# Patient Record
Sex: Female | Born: 1940 | Race: White | Hispanic: No | Marital: Married | Smoking: Never smoker
Health system: Southern US, Community
[De-identification: ages and names within clinical notes are randomized; demographics above are authoritative.]

## PROBLEM LIST (undated history)

## (undated) DIAGNOSIS — M1990 Unspecified osteoarthritis, unspecified site: Secondary | ICD-10-CM

## (undated) HISTORY — PX: TONSILLECTOMY: SUR1361

## (undated) HISTORY — PX: DILATION AND CURETTAGE OF UTERUS: SHX78

## (undated) HISTORY — PX: BREAST EXCISIONAL BIOPSY: SUR124

## (undated) HISTORY — PX: COLONOSCOPY: SHX174

## (undated) HISTORY — PX: APPENDECTOMY: SHX54

---

## 1999-09-09 ENCOUNTER — Other Ambulatory Visit: Admission: RE | Admit: 1999-09-09 | Discharge: 1999-09-09 | Payer: Self-pay | Admitting: Obstetrics and Gynecology

## 1999-10-06 ENCOUNTER — Encounter: Payer: Self-pay | Admitting: Obstetrics and Gynecology

## 1999-10-06 ENCOUNTER — Encounter: Admission: RE | Admit: 1999-10-06 | Discharge: 1999-10-06 | Payer: Self-pay | Admitting: Obstetrics and Gynecology

## 2000-02-14 ENCOUNTER — Encounter (INDEPENDENT_AMBULATORY_CARE_PROVIDER_SITE_OTHER): Payer: Self-pay | Admitting: *Deleted

## 2000-02-14 ENCOUNTER — Ambulatory Visit (HOSPITAL_COMMUNITY): Admission: RE | Admit: 2000-02-14 | Discharge: 2000-02-14 | Payer: Self-pay | Admitting: Gastroenterology

## 2000-11-01 ENCOUNTER — Other Ambulatory Visit: Admission: RE | Admit: 2000-11-01 | Discharge: 2000-11-01 | Payer: Self-pay | Admitting: Obstetrics and Gynecology

## 2000-11-12 ENCOUNTER — Encounter: Payer: Self-pay | Admitting: Obstetrics and Gynecology

## 2000-11-12 ENCOUNTER — Encounter: Admission: RE | Admit: 2000-11-12 | Discharge: 2000-11-12 | Payer: Self-pay | Admitting: Obstetrics and Gynecology

## 2000-11-15 ENCOUNTER — Encounter: Admission: RE | Admit: 2000-11-15 | Discharge: 2000-11-15 | Payer: Self-pay | Admitting: Obstetrics and Gynecology

## 2000-11-15 ENCOUNTER — Encounter: Payer: Self-pay | Admitting: Obstetrics and Gynecology

## 2002-09-02 ENCOUNTER — Other Ambulatory Visit: Admission: RE | Admit: 2002-09-02 | Discharge: 2002-09-02 | Payer: Self-pay | Admitting: Obstetrics and Gynecology

## 2002-10-07 ENCOUNTER — Encounter: Admission: RE | Admit: 2002-10-07 | Discharge: 2002-10-07 | Payer: Self-pay | Admitting: Obstetrics and Gynecology

## 2002-10-07 ENCOUNTER — Encounter: Payer: Self-pay | Admitting: Obstetrics and Gynecology

## 2003-10-29 ENCOUNTER — Encounter: Admission: RE | Admit: 2003-10-29 | Discharge: 2003-10-29 | Payer: Self-pay | Admitting: Obstetrics and Gynecology

## 2003-11-04 ENCOUNTER — Other Ambulatory Visit: Admission: RE | Admit: 2003-11-04 | Discharge: 2003-11-04 | Payer: Self-pay | Admitting: Obstetrics and Gynecology

## 2003-11-11 ENCOUNTER — Encounter: Admission: RE | Admit: 2003-11-11 | Discharge: 2003-11-11 | Payer: Self-pay | Admitting: Obstetrics and Gynecology

## 2004-01-08 ENCOUNTER — Ambulatory Visit (HOSPITAL_COMMUNITY): Admission: RE | Admit: 2004-01-08 | Discharge: 2004-01-08 | Payer: Self-pay | Admitting: Gastroenterology

## 2004-11-11 ENCOUNTER — Encounter: Admission: RE | Admit: 2004-11-11 | Discharge: 2004-11-11 | Payer: Self-pay | Admitting: Obstetrics and Gynecology

## 2004-12-21 ENCOUNTER — Other Ambulatory Visit: Admission: RE | Admit: 2004-12-21 | Discharge: 2004-12-21 | Payer: Self-pay | Admitting: Obstetrics and Gynecology

## 2005-11-16 ENCOUNTER — Encounter: Admission: RE | Admit: 2005-11-16 | Discharge: 2005-11-16 | Payer: Self-pay | Admitting: Obstetrics and Gynecology

## 2006-11-29 ENCOUNTER — Encounter: Admission: RE | Admit: 2006-11-29 | Discharge: 2006-11-29 | Payer: Self-pay | Admitting: Obstetrics and Gynecology

## 2007-12-02 ENCOUNTER — Encounter: Admission: RE | Admit: 2007-12-02 | Discharge: 2007-12-02 | Payer: Self-pay | Admitting: Obstetrics and Gynecology

## 2008-12-02 ENCOUNTER — Encounter: Admission: RE | Admit: 2008-12-02 | Discharge: 2008-12-02 | Payer: Self-pay | Admitting: Obstetrics and Gynecology

## 2010-02-04 ENCOUNTER — Encounter: Admission: RE | Admit: 2010-02-04 | Discharge: 2010-02-04 | Payer: Self-pay | Admitting: Obstetrics and Gynecology

## 2010-08-26 NOTE — Procedures (Signed)
[place]  Patient:    Kara Willis, Kara Willis Visit Number: [id number] MRN: [id number]          Service Type: END Location: ENDO Attending Physician:  Charna Kanija Proc. Date: 02/14/00 Admit Date:  02/14/2000   CC:         Silverio Lay, M.D.  Merlene Laughter. Renae Gloss, M.D.   Procedure Report  DATE OF BIRTH:  Feb 21, 1941  REFERRING PHYSICIAN:  PROCEDURE PERFORMED:  Colonoscopy with snare polypectomy x 1 and hot biopsy x 1.  ENDOSCOPIST:  Anselmo Rod, M.D.  INSTRUMENT USED:  Olympus video colonoscope.  INDICATIONS FOR PROCEDURE:  Guaiac positive stools in a 70 year old white female, rule out colonic polyps, masses, hemorrhoids, etc.  PREPROCEDURE PREPARATION:  Informed consent was procured from the patient. The patient was fasted for eight hours prior to the procedure and prepped with a bottle of magnesium citrate and a gallon of NuLytely the night prior to the procedure.  PREPROCEDURE PHYSICAL:  The patient had stable vital signs.  Neck supple. Chest clear to auscultation.  S1, S2 regular.  Abdomen soft with normal abdominal bowel sounds.  DESCRIPTION OF PROCEDURE:  The patient was placed in the left lateral decubitus position and sedated with 100 mcg of fentanyl and 3 mg of Versed intravenously.  Once the patient was adequately sedated and maintained on low-flow oxygen and continuous cardiac monitoring, the Olympus video colonoscope was advanced from the rectum to the cecum without difficulty. Except for a few early diverticula in the left colon (very small) and small sessile polyp in the rectum and at 15 cm.  No other abnormalities were seen. The polyp in the rectum was hot biopsied. The polyp at 15 cm was removed by snare polypectomy.  The patient tolerated the procedure well without complication.  No other masses or polyps were seen.  IMPRESSION: 1. Two polyps, one in the rectum, one at 15 cm. 2. Early diverticula in  the right colon.  RECOMMENDATIONS: 1. Await pathology results. 2. Increase fluid and fiber in the diet. 3. Outpatient follow-up in the next two weeks. Attending Physician:  Charna Lorrene DD:  02/14/00 TD:  02/14/00 Job: [id number]

## 2010-08-26 NOTE — Op Note (Signed)
Kara Willis, DOWSE           ACCOUNT NO.:  [id number]   MEDICAL RECORD NO.:  [id number]          PATIENT TYPE:  AMB   LOCATION:  ENDO                         FACILITY:  [facility]   PHYSICIAN:  Anselmo Rod, M.D.  DATE OF BIRTH:  04-07-1941   DATE OF PROCEDURE:  01/08/2004  DATE OF DISCHARGE:                                 OPERATIVE REPORT   PROCEDURE:  Screening colonoscopy.   ENDOSCOPIST:  Anselmo Rod, M.D.   INSTRUMENT:  Olympus video colonoscope.   INDICATIONS FOR PROCEDURE:  A 70 year old white female with history of  adenomatous polyps removed in the past with rectal bleeding. Rule out  recurrent polyps.   PRE-PROCEDURE PREPARATION:  Informed consent was procured from the patient.  Patient fasted for 8 hours prior to the procedure and prepped with a bottle  of magnesium citrate and a gallon of GoLYTELY the night prior to the  procedure.  Pre-procedure physical:  Patient had stable vital signs, neck  supple, chest clear to auscultation, S1/S2 regular, abdomen soft with normal  bowel sounds.   DESCRIPTION OF PROCEDURE:  The patient was placed in the left lateral  decubitus position, sedated with 70 mg of Demerol and 7 mg of Versed in  slow, incremental doses.  Once the patient was adequately sedated and  maintained on low flow oxygen, continuous cardiac monitoring; the Olympus  video colonoscope was advanced from the rectum to the cecum.  Small internal  hemorrhoids were seen on retroflexion.  The patient's position had to be  turned from the left lateral to supine, and the right lateral position with  gentle application of abdominal pressure. No masses or polyps were  identified. There was a large amount of residual stool in the colon.  Small  lesions could have been missed.   IMPRESSION:  1.  Small internal hemorrhoids, otherwise unrevealing colonoscopy.  2.  A significant amount of residual stool in the colon, multiple washings      done, patient's position  changed multiple times.  Small lesions could      have been missed.   RECOMMENDATIONS:  1.  Continue a high fiber diet with liberal fluids intake.  2.  Repeat colonoscopy had been recommended in the next 5 years unless the      patient develops any abnormal      symptoms in the interim.  3.  Outpatient follow up if rectal bleeding continues to be a problem.  4.  Avoid all nonsteroidals for now.       JNM/MEDQ  D:  01/08/2004  T:  01/09/2004  Job:  [id number]   cc:   Dois Davenport A. Rivard, M.D.  [address]  [place]  [place] [address]  Fax: [phone]

## 2011-03-09 ENCOUNTER — Other Ambulatory Visit: Payer: Self-pay | Admitting: Obstetrics and Gynecology

## 2011-03-09 DIAGNOSIS — Z1231 Encounter for screening mammogram for malignant neoplasm of breast: Secondary | ICD-10-CM

## 2011-04-14 ENCOUNTER — Ambulatory Visit
Admission: RE | Admit: 2011-04-14 | Discharge: 2011-04-14 | Disposition: A | Discharge status: Home / Self Care | Payer: Medicare Other | Source: Ambulatory Visit | Attending: Obstetrics and Gynecology | Admitting: Obstetrics and Gynecology

## 2011-04-14 DIAGNOSIS — Z1231 Encounter for screening mammogram for malignant neoplasm of breast: Secondary | ICD-10-CM

## 2012-03-14 ENCOUNTER — Other Ambulatory Visit: Payer: Self-pay | Admitting: Obstetrics and Gynecology

## 2012-03-14 DIAGNOSIS — Z1231 Encounter for screening mammogram for malignant neoplasm of breast: Secondary | ICD-10-CM

## 2012-04-22 ENCOUNTER — Ambulatory Visit
Admission: RE | Admit: 2012-04-22 | Discharge: 2012-04-22 | Disposition: A | Discharge status: Home / Self Care | Payer: Medicare Other | Source: Ambulatory Visit | Attending: Obstetrics and Gynecology | Admitting: Obstetrics and Gynecology

## 2012-04-22 DIAGNOSIS — Z1231 Encounter for screening mammogram for malignant neoplasm of breast: Secondary | ICD-10-CM

## 2012-06-26 ENCOUNTER — Other Ambulatory Visit: Payer: Self-pay

## 2013-05-12 ENCOUNTER — Other Ambulatory Visit: Payer: Self-pay

## 2013-05-12 DIAGNOSIS — Z1231 Encounter for screening mammogram for malignant neoplasm of breast: Secondary | ICD-10-CM

## 2013-05-29 ENCOUNTER — Ambulatory Visit
Admission: RE | Admit: 2013-05-29 | Discharge: 2013-05-29 | Disposition: A | Discharge status: Home / Self Care | Payer: Medicare HMO | Source: Ambulatory Visit

## 2013-05-29 DIAGNOSIS — Z1231 Encounter for screening mammogram for malignant neoplasm of breast: Secondary | ICD-10-CM

## 2013-09-23 ENCOUNTER — Other Ambulatory Visit: Payer: Self-pay | Admitting: Family Medicine

## 2013-09-23 DIAGNOSIS — E2839 Other primary ovarian failure: Secondary | ICD-10-CM

## 2013-10-23 ENCOUNTER — Ambulatory Visit
Admission: RE | Admit: 2013-10-23 | Discharge: 2013-10-23 | Disposition: A | Discharge status: Home / Self Care | Payer: Medicare HMO | Source: Ambulatory Visit | Attending: Family Medicine | Admitting: Family Medicine

## 2013-10-23 DIAGNOSIS — E2839 Other primary ovarian failure: Secondary | ICD-10-CM

## 2014-02-19 ENCOUNTER — Other Ambulatory Visit: Payer: Self-pay | Admitting: Orthopaedic Surgery

## 2014-04-08 ENCOUNTER — Encounter (HOSPITAL_COMMUNITY)
Admission: RE | Admit: 2014-04-08 | Discharge: 2014-04-08 | Disposition: A | Discharge status: Home / Self Care | Payer: Medicare HMO | Source: Ambulatory Visit | Attending: Orthopaedic Surgery | Admitting: Orthopaedic Surgery

## 2014-04-08 ENCOUNTER — Encounter (HOSPITAL_COMMUNITY): Payer: Self-pay

## 2014-04-08 DIAGNOSIS — Z681 Body mass index (BMI) 19 or less, adult: Secondary | ICD-10-CM | POA: Insufficient documentation

## 2014-04-08 DIAGNOSIS — M1990 Unspecified osteoarthritis, unspecified site: Secondary | ICD-10-CM | POA: Diagnosis not present

## 2014-04-08 DIAGNOSIS — J8410 Pulmonary fibrosis, unspecified: Secondary | ICD-10-CM | POA: Diagnosis not present

## 2014-04-08 DIAGNOSIS — I252 Old myocardial infarction: Secondary | ICD-10-CM | POA: Diagnosis not present

## 2014-04-08 DIAGNOSIS — E669 Obesity, unspecified: Secondary | ICD-10-CM | POA: Diagnosis not present

## 2014-04-08 DIAGNOSIS — Z01818 Encounter for other preprocedural examination: Secondary | ICD-10-CM | POA: Insufficient documentation

## 2014-04-08 HISTORY — DX: Unspecified osteoarthritis, unspecified site: M19.90

## 2014-04-08 LAB — BASIC METABOLIC PANEL
Anion gap: 5 (ref 5–15)
BUN: 9 mg/dL (ref 6–23)
CO2: 30 mmol/L (ref 19–32)
CREATININE: 0.81 mg/dL (ref 0.50–1.10)
Calcium: 9 mg/dL (ref 8.4–10.5)
Chloride: 104 mEq/L (ref 96–112)
GFR, EST AFRICAN AMERICAN: 83 mL/min — AB (ref >90)
GFR, EST NON AFRICAN AMERICAN: 71 mL/min — AB (ref >90)
GLUCOSE: 75 mg/dL (ref 70–99)
Potassium: 3.9 mmol/L (ref 3.5–5.1)
Sodium: 139 mmol/L (ref 135–145)

## 2014-04-08 LAB — CBC WITH DIFFERENTIAL/PLATELET
BASOS ABS: 0 10*3/uL (ref 0.0–0.1)
Basophils Relative: 1 % (ref 0–1)
EOS ABS: 0.2 10*3/uL (ref 0.0–0.7)
Eosinophils Relative: 3 % (ref 0–5)
HCT: 38.2 % (ref 36.0–46.0)
Hemoglobin: 12.3 g/dL (ref 12.0–15.0)
Lymphocytes Relative: 32 % (ref 12–46)
Lymphs Abs: 1.9 10*3/uL (ref 0.7–4.0)
MCH: 31.1 pg (ref 26.0–34.0)
MCHC: 32.2 g/dL (ref 30.0–36.0)
MCV: 96.5 fL (ref 78.0–100.0)
Monocytes Absolute: 0.4 10*3/uL (ref 0.1–1.0)
Monocytes Relative: 6 % (ref 3–12)
NEUTROS ABS: 3.5 10*3/uL (ref 1.7–7.7)
Neutrophils Relative %: 58 % (ref 43–77)
PLATELETS: 274 10*3/uL (ref 150–400)
RBC: 3.96 MIL/uL (ref 3.87–5.11)
RDW: 13 % (ref 11.5–15.5)
WBC: 5.9 10*3/uL (ref 4.0–10.5)

## 2014-04-08 LAB — APTT: APTT: 29 s (ref 24–37)

## 2014-04-08 LAB — TYPE AND SCREEN
ABO/RH(D): A POS
Antibody Screen: NEGATIVE

## 2014-04-08 LAB — ABO/RH: ABO/RH(D): A POS

## 2014-04-08 LAB — PROTIME-INR
INR: 1.01 (ref 0.00–1.49)
Prothrombin Time: 13.4 seconds (ref 11.6–15.2)

## 2014-04-08 LAB — SURGICAL PCR SCREEN
MRSA, PCR: NEGATIVE
Staphylococcus aureus: NEGATIVE

## 2014-04-08 NOTE — Progress Notes (Signed)
Primary - dr Rosezetta Schlatterburnette ([facility]) No cardiologist - ekg maybe 3 years ago.

## 2014-04-08 NOTE — Pre-Procedure Instructions (Signed)
Lorraine Laxlizabeth D Ruest  04/08/2014   Your procedure is scheduled on:  Tuesday, January 12th  [facility] at [facility] AM.  Call this number if you have problems the morning of surgery: [phone]   Remember:   Do not eat food or drink liquids after midnight.   Take these medicines the morning of surgery with A SIP OF WATER: none  Stop aspirin and celebrex 1/5   Do not wear jewelry, make-up or nail polish.  Do not wear lotions, powders, or perfume,deodorant.  Do not shave 48 hours prior to surgery. Men may shave face and neck.  Do not bring valuables to the hospital.  [facility] is not responsible  for any belongings or valuables.               Contacts, dentures or bridgework may not be worn into surgery.  Leave suitcase in the car. After surgery it may be brought to your room.  For patients admitted to the hospital, discharge time is determined by your  treatment team.           Please read over the following fact sheets that you were given: Pain Booklet, Coughing and Deep Breathing, Blood Transfusion Information, MRSA Information and Surgical Site Infection Prevention  [place] - Preparing for Surgery  Before surgery, you can play an important role.  Because skin is not sterile, your skin needs to be as free of germs as possible.  You can reduce the number of germs on you skin by washing with CHG (chlorahexidine gluconate) soap before surgery.  CHG is an antiseptic cleaner which kills germs and bonds with the skin to continue killing germs even after washing.  Please DO NOT use if you have an allergy to CHG or antibacterial soaps.  If your skin becomes reddened/irritated stop using the CHG and inform your nurse when you arrive at Short Stay.  Do not shave (including legs and underarms) for at least 48 hours prior to the first CHG shower.  You may shave your face.  Please follow these instructions carefully:   1.  Shower with CHG Soap the night before  surgery and the morning of Surgery.  2.  If you choose to wash your hair, wash your hair first as usual with your normal shampoo.  3.  After you shampoo, rinse your hair and body thoroughly to remove the shampoo.  4.  Use CHG as you would any other liquid soap.  You can apply CHG directly to the skin and wash gently with scrungie or a clean washcloth.  5.  Apply the CHG Soap to your body ONLY FROM THE NECK DOWN.  Do not use on open wounds or open sores.  Avoid contact with your eyes, ears, mouth and genitals (private parts).  Wash genitals (private parts) with your normal soap.  6.  Wash thoroughly, paying special attention to the area where your surgery will be performed.  7.  Thoroughly rinse your body with warm water from the neck down.  8.  DO NOT shower/wash with your normal soap after using and rinsing off the CHG Soap.  9.  Pat yourself dry with a clean towel.            10.  Wear clean pajamas.            11.  Place clean sheets on your bed the night of your first shower and do not sleep with pets.  Day of Surgery  Do not apply any lotions/deoderants the morning of surgery.  Please wear clean clothes to the hospital/surgery center.

## 2014-04-09 LAB — URINE CULTURE: Colony Count: 45000

## 2014-04-09 NOTE — Progress Notes (Signed)
Anesthesia Chart Review:  Patient is a 73 year old female scheduled for left TKA on 04/21/14 by Dr. Jerl Santosalldorf.  History includes non-smoker, arthritis, appendectomy. PCP is Dr. Rosezetta SchlatterBurnette.  BMI is 31.7 consistent with mild obesity.  EKG on 04/08/14: NSR, cannot rule out anterior infarct (age undetermined). Currently, there is no comparison EKG available in Epic or Muse.  No EKG yet received from her PCP.  No CV symptoms documented at PAT.  No reported DM, CHF, CAD, or MI history.  Preoperative CXR and labs noted.   If no acute changes or new CV symptomology then I would anticipate that she could proceed as planned.  Kara Ochsllison Kara Loomer, PA-C [facility] Short Stay Center/Anesthesiology Phone [phone] 04/09/2014 1:12 PM

## 2014-04-17 NOTE — H&P (Signed)
TOTAL KNEE ADMISSION H&P  Patient is being admitted for left total knee arthroplasty.  Subjective:  Chief Complaint:left knee pain.  HPI: Kara Willis, 74 y.o. female, has a history of pain and functional disability in the left knee due to arthritis and has failed non-surgical conservative treatments for greater than 12 weeks to includeNSAID's and/or analgesics, corticosteriod injections, viscosupplementation injections, flexibility and strengthening excercises, weight reduction as appropriate and activity modification.  Onset of symptoms was gradual, starting 5 years ago with gradually worsening course since that time. The patient noted no past surgery on the left knee(s).  Patient currently rates pain in the left knee(s) at 10 out of 10 with activity. Patient has night pain, worsening of pain with activity and weight bearing, pain that interferes with activities of daily living, crepitus and joint swelling.  Patient has evidence of subchondral cysts, subchondral sclerosis, periarticular osteophytes and joint space narrowing by imaging studies. There is no active infection.  There are no active problems to display for this patient.  Past Medical History  Diagnosis Date  . Arthritis     Past Surgical History  Procedure Laterality Date  . Appendectomy    . Colonoscopy      No prescriptions prior to admission   No Known Allergies  History  Substance Use Topics  . Smoking status: Never Smoker   . Smokeless tobacco: Not on file  . Alcohol Use: 4.2 oz/week    7 Glasses of wine per week    No family history on file.   Review of Systems  Musculoskeletal: Positive for joint pain.       Left knee  All other systems reviewed and are negative.   Objective:  Physical Exam  Constitutional: She is oriented to person, place, and time. She appears well-developed and well-nourished.  HENT:  Head: Normocephalic and atraumatic.  Eyes: Conjunctivae are normal. Pupils are equal, round,  and reactive to light.  Neck: Normal range of motion.  Cardiovascular: Normal rate and regular rhythm.   Respiratory: Effort normal.  GI: Soft.  Musculoskeletal:  Both knees move about 0-115. She has medial greater than lateral joint line pain on both sides with some crepitation on both sides. Ligament exam is stable.  Hip motion is full and pain free and SLR is negative on both sides.  There is no palpable LAD behind either knee.  Sensation and motor function are intact on both sides and there are palpable pulses on both sides.  Neurological: She is alert and oriented to person, place, and time.  Skin: Skin is warm and dry.  Psychiatric: She has a normal mood and affect. Her behavior is normal. Judgment and thought content normal.    Vital signs in last 24 hours:    Labs:   There is no height or weight on file to calculate BMI.   Imaging Review Plain radiographs demonstrate severe degenerative joint disease of the left knee(s). The overall alignment isneutral. The bone quality appears to be good for age and reported activity level.  Assessment/Plan:  End stage primary arthritis, left knee   The patient history, physical examination, clinical judgment of the provider and imaging studies are consistent with end stage degenerative joint disease of the left knee(s) and total knee arthroplasty is deemed medically necessary. The treatment options including medical management, injection therapy arthroscopy and arthroplasty were discussed at length. The risks and benefits of total knee arthroplasty were presented and reviewed. The risks due to aseptic loosening, infection, stiffness, patella tracking  problems, thromboembolic complications and other imponderables were discussed. The patient acknowledged the explanation, agreed to proceed with the plan and consent was signed. Patient is being admitted for inpatient treatment for surgery, pain control, PT, OT, prophylactic antibiotics, VTE  prophylaxis, progressive ambulation and ADL's and discharge planning. The patient is planning to be discharged home with home health services

## 2014-04-23 ENCOUNTER — Encounter (HOSPITAL_COMMUNITY): Payer: Self-pay | Admitting: *Deleted

## 2014-04-23 MED ORDER — LACTATED RINGERS IV SOLN
INTRAVENOUS | Status: DC
  Administered 2014-04-24: 13:00:00 via INTRAVENOUS

## 2014-04-23 MED ORDER — CEFAZOLIN SODIUM-DEXTROSE 2-3 GM-% IV SOLR
2.0000 g | INTRAVENOUS | Status: AC
Start: 2014-04-24 — End: 2014-04-24
  Administered 2014-04-24: 2 g via INTRAVENOUS
  Filled 2014-04-23: qty 50

## 2014-04-23 NOTE — Anesthesia Preprocedure Evaluation (Addendum)
Anesthesia Evaluation  Patient identified by MRN, date of birth, ID band Patient awake    Reviewed: Allergy & Precautions, NPO status , Patient's Chart, lab work & pertinent test results, reviewed documented beta blocker date and time   Airway Mallampati: II  TM Distance: >3 FB Neck ROM: Full    Dental  (+) Teeth Intact, Dental Advisory Given   Pulmonary former smoker,  breath sounds clear to auscultation        Cardiovascular Rhythm:Regular Rate:Normal  EKG 12/@)!% NSR, possible old MI, none to compare to   Neuro/Psych    GI/Hepatic negative GI ROS, Neg liver ROS,   Endo/Other  negative endocrine ROS  Renal/GU negative Renal ROSGFR 80     Musculoskeletal   Abdominal   Peds  Hematology   Anesthesia Other Findings   Reproductive/Obstetrics                            Anesthesia Physical Anesthesia Plan  ASA: III  Anesthesia Plan: Spinal   Post-op Pain Management: MAC Combined w/ Regional for Post-op pain   Induction: Intravenous  Airway Management Planned: Oral ETT and LMA  Additional Equipment:   Intra-op Plan:   Post-operative Plan: Extubation in OR  Informed Consent: I have reviewed the patients History and Physical, chart, labs and discussed the procedure including the risks, benefits and alternatives for the proposed anesthesia with the patient or authorized representative who has indicated his/her understanding and acceptance.   Dental advisory given  Plan Discussed with: CRNA and Anesthesiologist  Anesthesia Plan Comments:       Anesthesia Quick Evaluation

## 2014-04-24 ENCOUNTER — Inpatient Hospital Stay (HOSPITAL_COMMUNITY)
Admission: RE | Admit: 2014-04-24 | Discharge: 2014-04-26 | DRG: 470 | Disposition: A | Discharge status: Home / Self Care | Payer: Medicare HMO | Source: Ambulatory Visit | Attending: Orthopaedic Surgery | Admitting: Orthopaedic Surgery

## 2014-04-24 ENCOUNTER — Encounter (HOSPITAL_COMMUNITY): Payer: Self-pay | Admitting: *Deleted

## 2014-04-24 ENCOUNTER — Encounter (HOSPITAL_COMMUNITY)
Admission: RE | Disposition: A | Discharge status: Home / Self Care | Payer: Self-pay | Source: Ambulatory Visit | Attending: Orthopaedic Surgery

## 2014-04-24 ENCOUNTER — Inpatient Hospital Stay (HOSPITAL_COMMUNITY): Payer: Medicare HMO | Admitting: Anesthesiology

## 2014-04-24 ENCOUNTER — Inpatient Hospital Stay (HOSPITAL_COMMUNITY): Payer: Medicare HMO | Admitting: Vascular Surgery

## 2014-04-24 DIAGNOSIS — Z9049 Acquired absence of other specified parts of digestive tract: Secondary | ICD-10-CM | POA: Diagnosis present

## 2014-04-24 DIAGNOSIS — M1710 Unilateral primary osteoarthritis, unspecified knee: Secondary | ICD-10-CM | POA: Diagnosis present

## 2014-04-24 DIAGNOSIS — M1712 Unilateral primary osteoarthritis, left knee: Secondary | ICD-10-CM | POA: Diagnosis present

## 2014-04-24 DIAGNOSIS — M25562 Pain in left knee: Secondary | ICD-10-CM | POA: Diagnosis present

## 2014-04-24 HISTORY — DX: Unspecified osteoarthritis, unspecified site: M19.90

## 2014-04-24 HISTORY — PX: TOTAL KNEE ARTHROPLASTY: SHX125

## 2014-04-24 SURGERY — ARTHROPLASTY, KNEE, TOTAL
Anesthesia: Spinal | Site: Knee | Laterality: Left

## 2014-04-24 MED ORDER — LACTATED RINGERS IV SOLN
INTRAVENOUS | Status: DC
  Administered 2014-04-26: 06:00:00 via INTRAVENOUS

## 2014-04-24 MED ORDER — ASPIRIN EC 325 MG PO TBEC
325.0000 mg | DELAYED_RELEASE_TABLET | Freq: Two times a day (BID) | ORAL | Status: DC
  Administered 2014-04-25 – 2014-04-26 (×3): 325 mg via ORAL
  Filled 2014-04-24 (×5): qty 1

## 2014-04-24 MED ORDER — FENTANYL CITRATE 0.05 MG/ML IJ SOLN: 25.0000 ug | INTRAMUSCULAR | Status: DC | PRN

## 2014-04-24 MED ORDER — FENTANYL CITRATE 0.05 MG/ML IJ SOLN
50.0000 ug | Freq: Once | INTRAMUSCULAR | Status: AC
  Administered 2014-04-24: 50 ug via INTRAVENOUS

## 2014-04-24 MED ORDER — PROPOFOL 10 MG/ML IV BOLUS
INTRAVENOUS | Status: AC
  Filled 2014-04-24: qty 20

## 2014-04-24 MED ORDER — OXYCODONE HCL 5 MG PO TABS: 5.0000 mg | ORAL_TABLET | Freq: Once | ORAL | Status: DC | PRN

## 2014-04-24 MED ORDER — DIPHENHYDRAMINE HCL 12.5 MG/5ML PO ELIX: 12.5000 mg | ORAL_SOLUTION | ORAL | Status: DC | PRN

## 2014-04-24 MED ORDER — ONDANSETRON HCL 4 MG/2ML IJ SOLN
INTRAMUSCULAR | Status: AC
  Filled 2014-04-24: qty 4

## 2014-04-24 MED ORDER — ASPIRIN EC 325 MG PO TBEC: 325.0000 mg | DELAYED_RELEASE_TABLET | Freq: Two times a day (BID) | ORAL | Status: DC

## 2014-04-24 MED ORDER — MIDAZOLAM HCL 2 MG/2ML IJ SOLN
INTRAMUSCULAR | Status: AC
  Administered 2014-04-24: 1 mg via INTRAVENOUS
  Filled 2014-04-24: qty 2

## 2014-04-24 MED ORDER — PHENYLEPHRINE HCL 10 MG/ML IJ SOLN
10.0000 mg | INTRAVENOUS | Status: DC | PRN
  Administered 2014-04-24: 20 ug/min via INTRAVENOUS

## 2014-04-24 MED ORDER — HYDROCODONE-ACETAMINOPHEN 5-325 MG PO TABS: 1.0000 | ORAL_TABLET | ORAL | Status: DC | PRN

## 2014-04-24 MED ORDER — ROCURONIUM BROMIDE 50 MG/5ML IV SOLN
INTRAVENOUS | Status: AC
  Filled 2014-04-24: qty 1

## 2014-04-24 MED ORDER — ONDANSETRON HCL 4 MG PO TABS: 4.0000 mg | ORAL_TABLET | Freq: Four times a day (QID) | ORAL | Status: DC | PRN

## 2014-04-24 MED ORDER — TRANEXAMIC ACID 100 MG/ML IV SOLN
2000.0000 mg | INTRAVENOUS | Status: AC
  Administered 2014-04-24: 2000 mg via TOPICAL
  Filled 2014-04-24: qty 20

## 2014-04-24 MED ORDER — LACTATED RINGERS IV SOLN
INTRAVENOUS | Status: DC | PRN
  Administered 2014-04-24 (×2): via INTRAVENOUS

## 2014-04-24 MED ORDER — ONDANSETRON HCL 4 MG/2ML IJ SOLN: 4.0000 mg | Freq: Once | INTRAMUSCULAR | Status: DC | PRN

## 2014-04-24 MED ORDER — MIDAZOLAM HCL 2 MG/2ML IJ SOLN
1.0000 mg | Freq: Once | INTRAMUSCULAR | Status: AC
  Administered 2014-04-24: 1 mg via INTRAVENOUS

## 2014-04-24 MED ORDER — HYDROMORPHONE HCL 1 MG/ML IJ SOLN: 0.5000 mg | INTRAMUSCULAR | Status: DC | PRN

## 2014-04-24 MED ORDER — PROPOFOL 10 MG/ML IV BOLUS
INTRAVENOUS | Status: DC | PRN
  Administered 2014-04-24 (×2): 40 mg via INTRAVENOUS
  Administered 2014-04-24: 20 mg via INTRAVENOUS

## 2014-04-24 MED ORDER — ALUM & MAG HYDROXIDE-SIMETH 200-200-20 MG/5ML PO SUSP: 30.0000 mL | ORAL | Status: DC | PRN

## 2014-04-24 MED ORDER — MENTHOL 3 MG MT LOZG: 1.0000 | LOZENGE | OROMUCOSAL | Status: DC | PRN

## 2014-04-24 MED ORDER — ACETAMINOPHEN 325 MG PO TABS
650.0000 mg | ORAL_TABLET | Freq: Four times a day (QID) | ORAL | Status: DC | PRN
  Filled 2014-04-24: qty 2

## 2014-04-24 MED ORDER — ONDANSETRON HCL 4 MG/2ML IJ SOLN: 4.0000 mg | Freq: Four times a day (QID) | INTRAMUSCULAR | Status: DC | PRN

## 2014-04-24 MED ORDER — PROMETHAZINE HCL 25 MG/ML IJ SOLN: 6.2500 mg | INTRAMUSCULAR | Status: DC | PRN

## 2014-04-24 MED ORDER — METHOCARBAMOL 1000 MG/10ML IJ SOLN
500.0000 mg | Freq: Four times a day (QID) | INTRAVENOUS | Status: DC | PRN
  Filled 2014-04-24: qty 5

## 2014-04-24 MED ORDER — MEPERIDINE HCL 25 MG/ML IJ SOLN: 6.2500 mg | INTRAMUSCULAR | Status: DC | PRN

## 2014-04-24 MED ORDER — FENTANYL CITRATE 0.05 MG/ML IJ SOLN
INTRAMUSCULAR | Status: AC
  Filled 2014-04-24: qty 5

## 2014-04-24 MED ORDER — SODIUM CHLORIDE 0.9 % IR SOLN
Status: DC | PRN
  Administered 2014-04-24: 1000 mL

## 2014-04-24 MED ORDER — METOCLOPRAMIDE HCL 10 MG PO TABS: 5.0000 mg | ORAL_TABLET | Freq: Three times a day (TID) | ORAL | Status: DC | PRN

## 2014-04-24 MED ORDER — OXYCODONE HCL 5 MG/5ML PO SOLN: 5.0000 mg | Freq: Once | ORAL | Status: DC | PRN

## 2014-04-24 MED ORDER — BUPIVACAINE LIPOSOME 1.3 % IJ SUSP
20.0000 mL | Freq: Once | INTRAMUSCULAR | Status: AC
  Administered 2014-04-24: 20 mL
  Filled 2014-04-24: qty 20

## 2014-04-24 MED ORDER — DOCUSATE SODIUM 100 MG PO CAPS
100.0000 mg | ORAL_CAPSULE | Freq: Two times a day (BID) | ORAL | Status: DC
  Administered 2014-04-24 – 2014-04-26 (×4): 100 mg via ORAL
  Filled 2014-04-24 (×5): qty 1

## 2014-04-24 MED ORDER — FENTANYL CITRATE 0.05 MG/ML IJ SOLN
INTRAMUSCULAR | Status: DC | PRN
  Administered 2014-04-24: 50 ug via INTRAVENOUS

## 2014-04-24 MED ORDER — ACETAMINOPHEN 650 MG RE SUPP: 650.0000 mg | Freq: Four times a day (QID) | RECTAL | Status: DC | PRN

## 2014-04-24 MED ORDER — HYDROCODONE-ACETAMINOPHEN 5-325 MG PO TABS
1.0000 | ORAL_TABLET | ORAL | Status: DC | PRN
  Administered 2014-04-24 – 2014-04-26 (×7): 2 via ORAL
  Filled 2014-04-24 (×8): qty 2

## 2014-04-24 MED ORDER — PROPOFOL INFUSION 10 MG/ML OPTIME
INTRAVENOUS | Status: DC | PRN
  Administered 2014-04-24: 100 ug/kg/min via INTRAVENOUS

## 2014-04-24 MED ORDER — BUPIVACAINE IN DEXTROSE 0.75-8.25 % IT SOLN
INTRATHECAL | Status: DC | PRN
  Administered 2014-04-24: 1.8 mL via INTRATHECAL

## 2014-04-24 MED ORDER — PRAVASTATIN SODIUM 40 MG PO TABS
40.0000 mg | ORAL_TABLET | Freq: Every day | ORAL | Status: DC
  Administered 2014-04-24 – 2014-04-25 (×2): 40 mg via ORAL
  Filled 2014-04-24 (×3): qty 1

## 2014-04-24 MED ORDER — METHOCARBAMOL 500 MG PO TABS
500.0000 mg | ORAL_TABLET | Freq: Four times a day (QID) | ORAL | Status: DC | PRN
  Administered 2014-04-24 – 2014-04-25 (×2): 500 mg via ORAL
  Filled 2014-04-24 (×2): qty 1

## 2014-04-24 MED ORDER — METHOCARBAMOL 500 MG PO TABS: 500.0000 mg | ORAL_TABLET | Freq: Four times a day (QID) | ORAL | Status: DC | PRN

## 2014-04-24 MED ORDER — METOCLOPRAMIDE HCL 5 MG/ML IJ SOLN: 5.0000 mg | Freq: Three times a day (TID) | INTRAMUSCULAR | Status: DC | PRN

## 2014-04-24 MED ORDER — LIDOCAINE HCL (CARDIAC) 20 MG/ML IV SOLN
INTRAVENOUS | Status: AC
  Filled 2014-04-24: qty 5

## 2014-04-24 MED ORDER — PHENOL 1.4 % MT LIQD: 1.0000 | OROMUCOSAL | Status: DC | PRN

## 2014-04-24 MED ORDER — BUPIVACAINE LIPOSOME 1.3 % IJ SUSP
20.0000 mL | Freq: Once | INTRAMUSCULAR | Status: DC
  Filled 2014-04-24: qty 20

## 2014-04-24 MED ORDER — BISACODYL 5 MG PO TBEC: 5.0000 mg | DELAYED_RELEASE_TABLET | Freq: Every day | ORAL | Status: DC | PRN

## 2014-04-24 MED ORDER — CEFAZOLIN SODIUM-DEXTROSE 2-3 GM-% IV SOLR
2.0000 g | Freq: Four times a day (QID) | INTRAVENOUS | Status: AC
  Administered 2014-04-24 – 2014-04-25 (×2): 2 g via INTRAVENOUS
  Filled 2014-04-24 (×2): qty 50

## 2014-04-24 MED ORDER — FENTANYL CITRATE 0.05 MG/ML IJ SOLN
INTRAMUSCULAR | Status: AC
  Filled 2014-04-24: qty 2

## 2014-04-24 SURGICAL SUPPLY — 77 items
APL SKNCLS STERI-STRIP NONHPOA (GAUZE/BANDAGES/DRESSINGS) ×1
BAG DECANTER FOR FLEXI CONT (MISCELLANEOUS) ×3 IMPLANT
BANDAGE ELASTIC 4 VELCRO ST LF (GAUZE/BANDAGES/DRESSINGS) ×1 IMPLANT
BANDAGE ESMARK 6X9 LF (GAUZE/BANDAGES/DRESSINGS) ×1 IMPLANT
BENZOIN TINCTURE PRP APPL 2/3 (GAUZE/BANDAGES/DRESSINGS) ×3 IMPLANT
BLADE SAGITTAL 25.0X1.19X90 (BLADE) ×1 IMPLANT
BLADE SAGITTAL 25.0X1.19X90MM (BLADE) ×1
BLADE SAW SGTL 13.0X1.19X90.0M (BLADE) ×2 IMPLANT
BLADE SURG ROTATE 9660 (MISCELLANEOUS) IMPLANT
BNDG CMPR 9X6 STRL LF SNTH (GAUZE/BANDAGES/DRESSINGS) ×1
BNDG CMPR MED 10X6 ELC LF (GAUZE/BANDAGES/DRESSINGS) ×1
BNDG COHESIVE 3X5 TAN STRL LF (GAUZE/BANDAGES/DRESSINGS) ×2 IMPLANT
BNDG ELASTIC 6X10 VLCR STRL LF (GAUZE/BANDAGES/DRESSINGS) ×3 IMPLANT
BNDG ESMARK 6X9 LF (GAUZE/BANDAGES/DRESSINGS) ×3
BNDG GAUZE ELAST 4 BULKY (GAUZE/BANDAGES/DRESSINGS) ×4 IMPLANT
BOWL SMART MIX CTS (DISPOSABLE) ×3 IMPLANT
CAP KNEE TOTAL 3 SIGMA ×2 IMPLANT
CEMENT HV SMART SET (Cement) ×6 IMPLANT
CLOSURE WOUND 1/2 X4 (GAUZE/BANDAGES/DRESSINGS) ×1
COVER SURGICAL LIGHT HANDLE (MISCELLANEOUS) ×3 IMPLANT
CUFF TOURNIQUET SINGLE 34IN LL (TOURNIQUET CUFF) ×3 IMPLANT
CUFF TOURNIQUET SINGLE 44IN (TOURNIQUET CUFF) IMPLANT
DRAPE EXTREMITY T 121X128X90 (DRAPE) ×3 IMPLANT
DRAPE IMP U-DRAPE 54X76 (DRAPES) ×3 IMPLANT
DRAPE PROXIMA HALF (DRAPES) ×3 IMPLANT
DRAPE U-SHAPE 47X51 STRL (DRAPES) ×3 IMPLANT
DRSG ADAPTIC 3X8 NADH LF (GAUZE/BANDAGES/DRESSINGS) ×3 IMPLANT
DRSG PAD ABDOMINAL 8X10 ST (GAUZE/BANDAGES/DRESSINGS) ×1 IMPLANT
DURAPREP 26ML APPLICATOR (WOUND CARE) ×3 IMPLANT
ELECT REM PT RETURN 9FT ADLT (ELECTROSURGICAL) ×3
ELECTRODE REM PT RTRN 9FT ADLT (ELECTROSURGICAL) ×1 IMPLANT
GAUZE SPONGE 4X4 12PLY STRL (GAUZE/BANDAGES/DRESSINGS) ×1 IMPLANT
GLOVE BIO SURGEON STRL SZ 6.5 (GLOVE) ×2 IMPLANT
GLOVE BIO SURGEON STRL SZ8 (GLOVE) ×6 IMPLANT
GLOVE BIO SURGEONS STRL SZ 6.5 (GLOVE) ×2
GLOVE BIOGEL PI IND STRL 8 (GLOVE) ×2 IMPLANT
GLOVE BIOGEL PI INDICATOR 8 (GLOVE) ×4
GOWN STRL REUS W/ TWL LRG LVL3 (GOWN DISPOSABLE) ×1 IMPLANT
GOWN STRL REUS W/ TWL XL LVL3 (GOWN DISPOSABLE) ×2 IMPLANT
GOWN STRL REUS W/TWL LRG LVL3 (GOWN DISPOSABLE) ×3
GOWN STRL REUS W/TWL XL LVL3 (GOWN DISPOSABLE) ×6
HANDPIECE INTERPULSE COAX TIP (DISPOSABLE) ×3
HOOD PEEL AWAY FACE SHEILD DIS (HOOD) ×6 IMPLANT
IMMOBILIZER KNEE 20 (SOFTGOODS) IMPLANT
IMMOBILIZER KNEE 22 UNIV (SOFTGOODS) ×3 IMPLANT
IMMOBILIZER KNEE 24 THIGH 36 (MISCELLANEOUS) IMPLANT
IMMOBILIZER KNEE 24 UNIV (MISCELLANEOUS)
KIT BASIN OR (CUSTOM PROCEDURE TRAY) ×3 IMPLANT
KIT ROOM TURNOVER OR (KITS) ×3 IMPLANT
MANIFOLD NEPTUNE II (INSTRUMENTS) ×3 IMPLANT
MARKER SKIN DUAL TIP RULER LAB (MISCELLANEOUS) ×2 IMPLANT
NDL HYPO 21X1 ECLIPSE (NEEDLE) ×1 IMPLANT
NEEDLE 22X1 1/2 (OR ONLY) (NEEDLE) ×2 IMPLANT
NEEDLE HYPO 21X1 ECLIPSE (NEEDLE) ×3 IMPLANT
NS IRRIG 1000ML POUR BTL (IV SOLUTION) ×3 IMPLANT
PACK TOTAL JOINT (CUSTOM PROCEDURE TRAY) ×3 IMPLANT
PACK UNIVERSAL I (CUSTOM PROCEDURE TRAY) ×3 IMPLANT
PAD ABD 8X10 STRL (GAUZE/BANDAGES/DRESSINGS) ×4 IMPLANT
PAD ARMBOARD 7.5X6 YLW CONV (MISCELLANEOUS) ×6 IMPLANT
SET HNDPC FAN SPRY TIP SCT (DISPOSABLE) ×1 IMPLANT
SPONGE GAUZE 4X4 12PLY STER LF (GAUZE/BANDAGES/DRESSINGS) ×2 IMPLANT
STAPLER VISISTAT 35W (STAPLE) IMPLANT
STRIP CLOSURE SKIN 1/2X4 (GAUZE/BANDAGES/DRESSINGS) ×2 IMPLANT
SUCTION FRAZIER TIP 10 FR DISP (SUCTIONS) ×2 IMPLANT
SUT MNCRL AB 3-0 PS2 18 (SUTURE) ×2 IMPLANT
SUT MNCRL AB 4-0 PS2 18 (SUTURE) ×2 IMPLANT
SUT VIC AB 0 CT1 27 (SUTURE) ×6
SUT VIC AB 0 CT1 27XBRD ANBCTR (SUTURE) ×2 IMPLANT
SUT VIC AB 2-0 CT1 27 (SUTURE) ×6
SUT VIC AB 2-0 CT1 TAPERPNT 27 (SUTURE) ×2 IMPLANT
SUT VLOC 180 0 24IN GS25 (SUTURE) ×3 IMPLANT
SYR 50ML LL SCALE MARK (SYRINGE) ×3 IMPLANT
TOWEL OR 17X24 6PK STRL BLUE (TOWEL DISPOSABLE) ×3 IMPLANT
TOWEL OR 17X26 10 PK STRL BLUE (TOWEL DISPOSABLE) ×3 IMPLANT
TRAY FOLEY CATH 14FR (SET/KITS/TRAYS/PACK) IMPLANT
UPCHARGE REV TRAY MBT KNEE ×2 IMPLANT
WATER STERILE IRR 1000ML POUR (IV SOLUTION) ×6 IMPLANT

## 2014-04-24 NOTE — Anesthesia Procedure Notes (Addendum)
Anesthesia Regional Block:  Adductor canal block  Pre-Anesthetic Checklist: ,, timeout performed, Correct Patient, Correct Site, Correct Laterality, Correct Procedure, Correct Position, site marked, Risks and benefits discussed,  Surgical consent,  Pre-op evaluation,  At surgeon's request and post-op pain management  Laterality: Left  Prep: chloraprep       Needles:  Injection technique: Single-shot  Needle Type: Echogenic Stimulator Needle     Needle Length: 9cm 9 cm Needle Gauge: 22 and 22 G    Additional Needles:  Procedures: ultrasound guided (picture in chart) Adductor canal block Narrative:  Start time: 04/24/2014 1:25 PM End time: 04/24/2014 1:30 PM Injection made incrementally with aspirations every 5 mL.  Performed by: Personally   Additional Notes: 25 cc 0.5% marcaine with 1:200 epi injected easily   Spinal Patient location during procedure: OR Staffing Anesthesiologist: MOSER, CHRIS Preanesthetic Checklist Completed: patient identified, surgical consent, pre-op evaluation, timeout performed, IV checked, risks and benefits discussed and monitors and equipment checked Spinal Block Patient position: sitting Prep: site prepped and draped and DuraPrep Patient monitoring: heart rate, cardiac monitor, continuous pulse ox and blood pressure Approach: midline Location: L4-5 Injection technique: single-shot Needle Needle type: Pencan  Needle gauge: 24 G Needle length: 10 cm Assessment Sensory level: T6  Date/Time: 04/24/2014 2:45 PM Performed by: Coralee RudFLORES, Kijuana Ruppel Pre-anesthesia Checklist: Patient identified, Emergency Drugs available, Suction available and Patient being monitored Patient Re-evaluated:Patient Re-evaluated prior to inductionOxygen Delivery Method: Simple face mask Preoxygenation: Pre-oxygenation with 100% oxygen Ventilation: Oral airway inserted - appropriate to patient size Placement Confirmation: positive ETCO2

## 2014-04-24 NOTE — Transfer of Care (Signed)
Immediate Anesthesia Transfer of Care Note  Patient: Kara Willis  Procedure(s) Performed: Procedure(s): LEFT TOTAL KNEE ARTHROPLASTY (Left)  Patient Location: PACU  Anesthesia Type:Spinal  Level of Consciousness: awake, alert , oriented and patient cooperative  Airway & Oxygen Therapy: Patient Spontanous Breathing and Patient connected to nasal cannula oxygen  Post-op Assessment: Report given to PACU RN, Post -op Vital signs reviewed and stable and Patient moving all extremities  Post vital signs: Reviewed and stable  Complications: No apparent anesthesia complications

## 2014-04-24 NOTE — Op Note (Signed)
PREOP DIAGNOSIS: DJD LEFT KNEE POSTOP DIAGNOSIS:  same PROCEDURE: LEFT TKR ANESTHESIA: Spinal and block ATTENDING SURGEON: Kaiyon Hynes G ASSISTANElodia Florence: Andrew Nida PA  INDICATIONS FOR PROCEDURE: Kara Willis is a 74 y.o. female who has struggled for a long time with pain due to degenerative arthritis of the left knee.  The patient has failed many conservative non-operative measures and at this point has pain which limits the ability to sleep and walk.  The patient is offered total knee replacement.  Informed operative consent was obtained after discussion of possible risks of anesthesia, infection, neurovascular injury, DVT, and death.  The importance of the post-operative rehabilitation protocol to optimize result was stressed extensively with the patient.  SUMMARY OF FINDINGS AND PROCEDURE:  Kara Willis was taken to the operative suite where under the above anesthesia a left knee replacement was performed.  There were advanced degenerative changes and the bone quality was good.  We used the DePuyLCS system and placed size standard plus femur, 4MBT tibia, 38 mm all polyethylene patella, and a size 10 mm spacer.  Elodia FlorenceAndrew Nida PA-C assisted throughout and was invaluable to the completion of the case in that he helped retract and maintain exposure while I placed the components.  He also helped close thereby minimizing OR time.  The patient was admitted for appropriate post-op care to include perioperative antibiotics and mechanical and pharmacologic measures for DVT prophylaxis.  DESCRIPTION OF PROCEDURE:  Kara Willis was taken to the operative suite where the above anesthesia was applied.  The patient was positioned supine and prepped and draped in normal sterile fashion.  An appropriate time out was performed.  After the administration of Kefzol pre-op antibiotic the leg was elevated and exsanguinated and a tourniquet inflated.  A standard longitudinal incision was made on the  anterior knee.  Dissection was carried down to the extensor mechanism.  All appropriate anti-infective measures were used including the pre-operative antibiotic, betadine impregnated drape, and closed hooded exhaust systems for each member of the surgical team.  A medial parapatellar incision was made in the extensor mechanism and the knee cap flipped and the knee flexed.  Some residual meniscal tissues were removed along with any remaining ACL/PCL tissue.  A guide was placed on the tibia and a flat cut was made on it's superior surface.  An intramedullary guide was placed in the femur and was utilized to make anterior and posterior cuts creating an appropriate flexion gap.  A second intramedullary guide was placed in the femur to make a distal cut properly balancing the knee with an extension gap equal to the flexion gap.  The three bones sized to the above mentioned sizes and the appropriate guides were placed and utilized.  A trial reduction was done and the knee easily came to full extension and the patella tracked well on flexion.  The trial components were removed and all bones were cleaned with pulsatile lavage and then dried thoroughly.  Cement was mixed and was pressurized onto the bones followed by placement of the aforementioned components.  Excess cement was trimmed and pressure was held on the components until the cement had hardened.  The tourniquet was deflated and a small amount of bleeding was controlled with cautery and pressure.  The knee was irrigated thoroughly.  The extensor mechanism was re-approximated with V-loc suture in running fashion.  The knee was flexed and the repair was solid.  The subcutaneous tissues were re-approximated with #0 and #2-0 vicryl and the skin  closed with a subcuticular stitch and steristrips.  A sterile dressing was applied.  Intraoperative fluids, EBL, and tourniquet time can be obtained from anesthesia records.  DISPOSITION:  The patient was taken to recovery  room in stable condition and admitted for appropriate post-op care to include peri-operative antibiotic and DVT prophylaxis with mechanical and pharmacologic measures.  Brookelin Felber G 04/24/2014, 4:06 PM

## 2014-04-24 NOTE — Progress Notes (Signed)
Lab work noted from 04-08-14, Dr Noreene LarssonJoslin in to see pt and states that is ok

## 2014-04-24 NOTE — Anesthesia Postprocedure Evaluation (Signed)
  Anesthesia Post-op Note  Patient: Kara Willis  Procedure(s) Performed: Procedure(s): LEFT TOTAL KNEE ARTHROPLASTY (Left)  Patient Location: PACU  Anesthesia Type:Regional and Spinal  Level of Consciousness: awake  Airway and Oxygen Therapy: Patient Spontanous Breathing  Post-op Pain: none  Post-op Assessment: Post-op Vital signs reviewed, Patient's Cardiovascular Status Stable, Respiratory Function Stable, Patent Airway, No signs of Nausea or vomiting and Pain level controlled  Post-op Vital Signs: Reviewed and stable  Last Vitals:  Filed Vitals:   04/24/14 1820  BP: 142/80  Pulse: 68  Temp: 36.5 C  Resp: 15    Complications: No apparent anesthesia complications

## 2014-04-24 NOTE — Progress Notes (Signed)
Orthopedic Tech Progress Note Patient Details:  Lorraine Laxlizabeth D Mckeag 05-Oct-1940 [id number]  CPM Left Knee CPM Left Knee: On Left Knee Flexion (Degrees): 90 Left Knee Extension (Degrees): 0 Additional Comments: applied ohf to bed   [name], Zai Chmiel Craig 04/24/2014, 6:04 PM

## 2014-04-24 NOTE — Interval H&P Note (Signed)
Ok for surgery PD 

## 2014-04-25 LAB — CBC
HCT: 33.7 % — ABNORMAL LOW (ref 36.0–46.0)
Hemoglobin: 11 g/dL — ABNORMAL LOW (ref 12.0–15.0)
MCH: 32 pg (ref 26.0–34.0)
MCHC: 32.6 g/dL (ref 30.0–36.0)
MCV: 98 fL (ref 78.0–100.0)
Platelets: 247 10*3/uL (ref 150–400)
RBC: 3.44 MIL/uL — ABNORMAL LOW (ref 3.87–5.11)
RDW: 12.7 % (ref 11.5–15.5)
WBC: 7.7 10*3/uL (ref 4.0–10.5)

## 2014-04-25 LAB — BASIC METABOLIC PANEL
Anion gap: 7 (ref 5–15)
BUN: 12 mg/dL (ref 6–23)
CALCIUM: 8.4 mg/dL (ref 8.4–10.5)
CO2: 31 mmol/L (ref 19–32)
Chloride: 100 mEq/L (ref 96–112)
Creatinine, Ser: 0.87 mg/dL (ref 0.50–1.10)
GFR calc Af Amer: 76 mL/min — ABNORMAL LOW (ref >90)
GFR calc non Af Amer: 65 mL/min — ABNORMAL LOW (ref >90)
Glucose, Bld: 121 mg/dL — ABNORMAL HIGH (ref 70–99)
Potassium: 4.2 mmol/L (ref 3.5–5.1)
Sodium: 138 mmol/L (ref 135–145)

## 2014-04-25 MED ORDER — SODIUM CHLORIDE 0.9 % IV BOLUS (SEPSIS)
500.0000 mL | Freq: Once | INTRAVENOUS | Status: AC
  Administered 2014-04-25: 500 mL via INTRAVENOUS

## 2014-04-25 NOTE — Evaluation (Signed)
Occupational Therapy Evaluation Patient Details Name: Kara Willis MRN: [id number] DOB: 10/13/1940 Today's Date: 04/25/2014    History of Present Illness 74 y.o. s/p Left TKA.   Clinical Impression   Pt s/p above. Pt independent with ADLs, PTA. Feel pt will benefit from acute OT to increase independence with BADLs prior to d/c.     Follow Up Recommendations  No OT follow up;Supervision - Intermittent    Equipment Recommendations  Other (comment) (AE)    Recommendations for Other Services       Precautions / Restrictions Precautions Precautions: Knee;Fall Precaution Booklet Issued:  (wrote down some information for pt) Precaution Comments: educated on precautions Required Braces or Orthoses: Knee Immobilizer - Left Restrictions Weight Bearing Restrictions: Yes LLE Weight Bearing: Weight bearing as tolerated      Mobility Bed Mobility Overal bed mobility: Needs Assistance Bed Mobility: Supine to Sit     Supine to sit: Supervision     General bed mobility comments: cues for technique  Transfers Overall transfer level: Needs assistance   Transfers: Sit to/from Stand Sit to Stand: Min guard         General transfer comment: cues for hand placement/technique.     Balance                                            ADL Overall ADL's : Needs assistance/impaired     Grooming: Wash/dry face;Sitting;Set up;Supervision/safety               Lower Body Dressing: Moderate assistance;Sit to/from stand   Toilet Transfer: Min guard;Ambulation;RW (from bed to chair)           Functional mobility during ADLs: Min guard;Rolling walker (educated on technique) General ADL Comments: Educated on LB dressing technique. Educated on safety such as safe shoewear, use of bag on walker, recommended spouse being with her for shower transfer, rugs/items on floor, sitting for most of LB ADLs. Educated on different shower transfer techniques and  using 3 in 1 for shower chair. Explained benefit of reaching to Left sock as it allows knee to bend.  Educated on incorporating precautions into functional activities. Educated on AE and pt practiced with reacher and sockaid.     Vision                     Perception     Praxis      Pertinent Vitals/Pain Pain Assessment: 0-10 Pain Score: 5  Pain Location: Left knee Pain Intervention(s): Monitored during session;Repositioned   ** Pt became sweaty/dizzy/nauseous. Pt's BP at end in chair 112/58 and O2 at 88% on RA-placed pt on 2L of O2. Nurse aware and in room.     Hand Dominance     Extremity/Trunk Assessment Upper Extremity Assessment Upper Extremity Assessment: Overall WFL for tasks assessed   Lower Extremity Assessment Lower Extremity Assessment: Defer to PT evaluation       Communication Communication Communication: No difficulties   Cognition Arousal/Alertness: Awake/alert Behavior During Therapy: WFL for tasks assessed/performed Overall Cognitive Status: Within Functional Limits for tasks assessed                     General Comments       Exercises Exercises: Other exercises Other Exercises Other Exercises: ankle pumps   Shoulder Instructions      Home Living Family/patient  expects to be discharged to:: Private residence Living Arrangements: Spouse/significant other Available Help at Discharge: Family;Available 24 hours/day Type of Home: House Home Access: Stairs to enter [employer] of Steps: 17 Entrance Stairs-Rails: Right       Bathroom Shower/Tub: Walk-in shower;Door   [employer] Toilet: Standard     Home Equipment: Environmental consultant - 2 wheels;Bedside commode          Prior Functioning/Environment Level of Independence: Independent             OT Diagnosis: Acute pain   OT Problem List: Decreased strength;Decreased range of motion;Decreased activity tolerance;Decreased knowledge of use of DME or AE;Decreased  knowledge of precautions;Pain   OT Treatment/Interventions: Self-care/ADL training;DME and/or AE instruction;Therapeutic exercise;Therapeutic activities;Balance training;Patient/family education    OT Goals(Current goals can be found in the care plan section) Acute Rehab OT Goals Patient Stated Goal: not stated OT Goal Formulation: With patient Time For Goal Achievement: 05/02/14 Potential to Achieve Goals: Good ADL Goals Pt Will Perform Lower Body Dressing: with modified independence;with caregiver independent in assisting;with adaptive equipment;sit to/from stand Pt Will Transfer to Toilet: with modified independence;ambulating (3 in 1 over commode) Pt Will Perform Tub/Shower Transfer: Shower transfer;with supervision;ambulating;rolling walker;3 in 1  OT Frequency: Min 2X/week   Barriers to D/C:            Co-evaluation              End of Session Equipment Utilized During Treatment: Gait belt;Rolling walker;Left knee immobilizer; O2 placed on towards end of session CPM Left Knee CPM Left Knee: Off Nurse Communication: Other (comment) (pt in chair and dizzy/nauseous)  Activity Tolerance: Other (comment) (nausea and dizziness) Patient left: in chair;with call bell/phone within reach   Time: 1029-1059 OT Time Calculation (min): 30 min Charges:  OT General Charges $OT Visit: 1 Procedure OT Evaluation $Initial OT Evaluation Tier I: 1 Procedure OT Treatments $Self Care/Home Management : 8-22 mins G-CodesEarlie Raveling OTR/L [phone] 04/25/2014, 11:18 AM

## 2014-04-25 NOTE — Evaluation (Signed)
Physical Therapy Evaluation Patient Details Name: ZENAIDA TESAR MRN: [id number] DOB: [birth year] Today's Date: 04/25/2014   History of Present Illness  74 y.o. s/p Left TKA.  Clinical Impression  Pt is s/p Lt TKA POD #1 resulting in the deficits listed below (see PT Problem List).  Pt will benefit from skilled PT to increase their independence and safety with mobility to allow discharge to the venue listed below. No c/o dizziness or lightheadedness with ambulation this session. Pt hopeful to D/C home tomorrow. Patient needs to practice stairs next session.         Follow Up Recommendations Home health PT;Supervision/Assistance - 24 hour    Equipment Recommendations   (reports all DME delievered )    Recommendations for Other Services       Precautions / Restrictions Precautions Precautions: Knee;Fall Precaution Booklet Issued:  (wrote down some information for pt) Precaution Comments: given HEP ; reviewed no pillow under knee Required Braces or Orthoses: Knee Immobilizer - Left Restrictions Weight Bearing Restrictions: Yes LLE Weight Bearing: Weight bearing as tolerated      Mobility  Bed Mobility Overal bed mobility: Needs Assistance Bed Mobility: Sit to Supine     Supine to sit: Supervision Sit to supine: Supervision   General bed mobility comments: cues for technique; using Rt LE to (A) Lt LE into bed  Transfers Overall transfer level: Needs assistance Equipment used: Rolling walker (2 wheeled) Transfers: Sit to/from Stand Sit to Stand: Min guard         General transfer comment: cues for technique and hand placement   Ambulation/Gait Ambulation/Gait assistance: Min guard Ambulation Distance (Feet): 60 Feet Assistive device: Rolling walker (2 wheeled) Gait Pattern/deviations: Step-to pattern;Step-through pattern;Decreased stance time - left;Decreased step length - right;Antalgic Gait velocity: decr Gait velocity interpretation: Below normal speed  for age/gender General Gait Details: pt looking at feet when ambulating; progressing slowly to step through gt with max cueing  Stairs            Wheelchair Mobility    Modified Rankin (Stroke Patients Only)       Balance Overall balance assessment: Needs assistance Sitting-balance support: Feet supported;No upper extremity supported Sitting balance-Leahy Scale: Fair     Standing balance support: During functional activity;Bilateral upper extremity supported Standing balance-Leahy Scale: Poor Standing balance comment: RW and UE support to balance at all times                             Pertinent Vitals/Pain Pain Assessment: 0-10 Pain Score: 5  Pain Location: reports 8/10 prior to therapy; 5/10 at end of session  Pain Descriptors / Indicators: Aching;Grimacing Pain Intervention(s): Monitored during session;Premedicated before session;Repositioned    Home Living Family/patient expects to be discharged to:: Private residence Living Arrangements: Spouse/significant other Available Help at Discharge: Family;Available 24 hours/day Type of Home: House Home Access: Stairs to enter Entrance Stairs-Rails: Right Entrance Stairs-Number of Steps: 17 Home Layout: Bed/bath upstairs Home Equipment: Walker - 2 wheels;Bedside commode      Prior Function Level of Independence: Independent               Hand Dominance        Extremity/Trunk Assessment   Upper Extremity Assessment: Overall WFL for tasks assessed           Lower Extremity Assessment: LLE deficits/detail   LLE Deficits / Details: 5 to 70 degrees in sitting  Cervical / Trunk Assessment: Normal  Communication   Communication: No difficulties  Cognition Arousal/Alertness: Awake/alert Behavior During Therapy: WFL for tasks assessed/performed Overall Cognitive Status: Within Functional Limits for tasks assessed                      General Comments General comments (skin  integrity, edema, etc.): encouraged OOBfor all meals    Exercises Total Joint Exercises Ankle Circles/Pumps: AROM;Both;10 reps;Seated Quad Sets: AROM;Left;10 reps;Supine Hip ABduction/ADduction: AAROM;Left;10 reps;Seated Long Arc Quad: AROM;Left;10 reps;Seated Other Exercises Other Exercises: ankle pumps      Assessment/Plan    PT Assessment Patient needs continued PT services  PT Diagnosis Difficulty walking;Generalized weakness;Acute pain   PT Problem List Decreased strength;Decreased balance;Decreased range of motion;Decreased activity tolerance;Decreased mobility;Decreased knowledge of use of DME;Decreased safety awareness;Pain  PT Treatment Interventions DME instruction;Gait training;Stair training;Functional mobility training;Therapeutic activities;Therapeutic exercise;Balance training;Neuromuscular re-education;Patient/family education   PT Goals (Current goals can be found in the Care Plan section) Acute Rehab PT Goals Patient Stated Goal: to go home tomorrow PT Goal Formulation: With patient Time For Goal Achievement: 05/02/14 Potential to Achieve Goals: Good    Frequency 7X/week   Barriers to discharge Inaccessible home environment 17 steps to get to room    Co-evaluation               End of Session Equipment Utilized During Treatment: Gait belt;Left knee immobilizer Activity Tolerance: Patient tolerated treatment well Patient left: in bed;with call bell/phone within reach;with family/visitor present Nurse Communication: Mobility status         Time: [id number] PT Time Calculation (min) (ACUTE ONLY): 27 min   Charges:   PT Evaluation $Initial PT Evaluation Tier I: 1 Procedure PT Treatments $Gait Training: 8-22 mins   PT G CodesDonell Sievert:        [place], [place]PT  [phone] 04/25/2014, 2:39 PM

## 2014-04-25 NOTE — Clinical Social Work Psychosocial (Signed)
Clinical Social Work Department BRIEF PSYCHOSOCIAL ASSESSMENT 04/25/2014  Patient:  Kara Willis, Kara Willis     Account Number:  [id number]     Admit date:  04/24/2014  Clinical Social Worker:  Hubert Azure  Date/Time:  04/25/2014 03:22 PM  Referred by:  Physician  Date Referred:  04/25/2014 Referred for  SNF Placement   Other Referral:   Interview type:  Patient Other interview type:    PSYCHOSOCIAL DATA Living Status:  HUSBAND Admitted from facility:   Level of care:   Primary support name:  Ciara Kagan ([phone]) Primary support relationship to patient:  SPOUSE Degree of support available:   Good    CURRENT CONCERNS Current Concerns  Post-Acute Placement   Other Concerns:    SOCIAL WORK ASSESSMENT / PLAN CSW met with patient and introduced self and explained role. Patient was sitting up in chair. CSW explained SNF placement process and discussed d/c plan with patient. Per patient, she resides at home with her husband. Patient states although she has 17 stairs to go up initially, she can remain upstairs and husband will be able to assist her. Patient states she can ambulate and participate in PT upstairs and husband does not work, so he is able to manage her. Per patient, she is agreeable to SNF if HHPT is not recommended.   Assessment/plan status:   Other assessment/ plan:   CSW reviewed chart and PT recommendation of HHPT. No further needs. RNCM to be made aware. CSW signing off.   Information/referral to community resources:    PATIENT'S/FAMILY'S RESPONSE TO PLAN OF CARE: Patient was cooperative and prefers to d/c home with HHPT.    [place], [place] Weekend Clinical Social Worker [phone]

## 2014-04-25 NOTE — Progress Notes (Signed)
Patient c/o dizziness, lightheadedness, sweating, and also numbness to L foot while on bedside commode. Patient's BP was 83/51. Otherwise, VSS.  Patient safely assisted back to bed with the help of 2 nurse techs. [facility] 75 [facility]/hour. [facility] answering service called and MD paged. Waiting for call back at this time.

## 2014-04-25 NOTE — Progress Notes (Signed)
Spoke with Jason CoopKayla Mckenzie, PA, regarding previous note. Also notified her that patient got lightheaded when working with OT earlier today.  Mckenzie ordred NS bolus and to continue to monitor.

## 2014-04-25 NOTE — Progress Notes (Signed)
Patient feels well after TKA No compliants  BP 100/53 mmHg  Pulse 70  Temp(Src) 99 F (37.2 C) (Oral)  Resp 16  Ht 5' 7.5" (1.715 m)  Wt 92.987 kg (205 lb)  BMI 31.62 kg/m2  SpO2 94%  Dressing intact CPM function appropriately  NVI   POD #1 after left TKA  - up with PT - ASA for 2 weeks - WBAT - likely d/c home tomorrow

## 2014-04-26 LAB — CBC
HEMATOCRIT: 30.5 % — AB (ref 36.0–46.0)
HEMOGLOBIN: 10.1 g/dL — AB (ref 12.0–15.0)
MCH: 31.7 pg (ref 26.0–34.0)
MCHC: 33.1 g/dL (ref 30.0–36.0)
MCV: 95.6 fL (ref 78.0–100.0)
Platelets: 217 10*3/uL (ref 150–400)
RBC: 3.19 MIL/uL — ABNORMAL LOW (ref 3.87–5.11)
RDW: 12.5 % (ref 11.5–15.5)
WBC: 12.1 10*3/uL — ABNORMAL HIGH (ref 4.0–10.5)

## 2014-04-26 NOTE — Progress Notes (Signed)
Occupational Therapy Treatment Patient Details Name: Kara Willis MRN: [id number] DOB: 1941-03-09 Today's Date: 04/26/2014    History of present illness 74 y.o. s/p Left TKA.   OT comments  Pt moving well and feel she is safe to d/c home with spouse available to assist. OT signing off.  Follow Up Recommendations  No OT follow up;Supervision - Intermittent    Equipment Recommendations  None recommended by OT    Recommendations for Other Services      Precautions / Restrictions Precautions Precautions: Knee;Fall Precaution Booklet Issued: No Precaution Comments: reviewed precautions Required Braces or Orthoses: Knee Immobilizer - Left Restrictions Weight Bearing Restrictions: Yes LLE Weight Bearing: Weight bearing as tolerated       Mobility Bed Mobility Overal bed mobility: Needs Assistance Bed Mobility: Supine to Sit;Sit to Supine     Supine to sit: Modified independent (Device/Increase time) Sit to supine: Modified independent (Device/Increase time)   General bed mobility comments: told pt at home she could use mattress to hold onto as she doesn't have rail.  Transfers Overall transfer level: Needs assistance Equipment used: Rolling walker (2 wheeled) Transfers: Sit to/from Stand Sit to Stand: Min guard   Comments: Cues for hand placement             Balance                                   ADL Overall ADL's : Needs assistance/impaired                     Lower Body Dressing: Set up;Supervision/safety;Sit to/from stand   Toilet Transfer: Min guard;Ambulation;RW (bed)       Tub/ Shower Transfer: Min guard;Ambulation;Rolling walker;Walk-in shower   Functional mobility during ADLs: Min guard;Rolling walker General ADL Comments: Explained benefit of reaching to donn/doff left sock as it allows knee to bend. Reviewed LB dressing technique. Pt able to recall some information OT covered yesterday. Reviewed safety tips.  Educated on shower transfer technique and pt practiced. discussed shower chair options. Pt able to perform LB dressing without AE today. explained she could use reacher for picking up items. Educated on knee immobilizer.      Vision                     Perception     Praxis      Cognition  Awake/Alert Behavior During Therapy: WFL for tasks assessed/performed Overall Cognitive Status: Within Functional Limits for tasks assessed                       Extremity/Trunk Assessment                  Shoulder Instructions       General Comments      Pertinent Vitals/ Pain       Pain Assessment: 0-10 Pain Score: 3  Pain Location: Lt knee Pain Descriptors / Indicators: Discomfort Pain Intervention(s): Monitored during session  Home Living                                          Prior Functioning/Environment              Frequency Min 2X/week     Progress Toward Goals  OT Goals(current  goals can now be found in the care plan section)  Progress towards OT goals: Progressing toward goals  Acute Rehab OT Goals Patient Stated Goal: go home OT Goal Formulation: With patient Time For Goal Achievement: 05/02/14 Potential to Achieve Goals: Good ADL Goals Pt Will Perform Lower Body Dressing: with modified independence;with caregiver independent in assisting;with adaptive equipment;sit to/from stand Pt Will Transfer to Toilet: with modified independence;ambulating (3 in 1 over commode) Pt Will Perform Tub/Shower Transfer: Shower transfer;with supervision;ambulating;rolling walker;3 in 1  Plan Discharge plan remains appropriate    Co-evaluation                 End of Session Equipment Utilized During Treatment: Gait belt;Rolling walker;Left knee immobilizer   Activity Tolerance Patient tolerated treatment well   Patient Left in bed;with call bell/phone within reach   Nurse Communication          Time: 1043-1101 OT  Time Calculation (min): 18 min  Charges: OT General Charges $OT Visit: 1 Procedure OT Treatments $Self Care/Home Management : 8-22 mins  Earlie Raveling OTR/L [phone] 04/26/2014, 12:06 PM

## 2014-04-26 NOTE — Progress Notes (Signed)
Physical Therapy Treatment Patient Details Name: Kara Willis MRN: [id number] DOB: Aug 26, 1940 Today's Date: 04/26/2014    History of Present Illness 74 y.o. s/p Left TKA.    PT Comments    Pt progressing well with mobility.  No c/o dizziness today.  Ambulated room<>stairwell & completed stair training.  Pt reports she has 17 steps to get to her bedroom- pt completed 12 steps today & did well.  Patient safe to D/C from a mobility standpoint based on progression towards goals set on PT eval.    Follow Up Recommendations  Home health PT;Supervision/Assistance - 24 hour     Equipment Recommendations       Recommendations for Other Services       Precautions / Restrictions Precautions Precautions: Knee;Fall Precaution Comments: reviewed HEP ; reviewed no pillow under knee Required Braces or Orthoses: Knee Immobilizer - Left Restrictions Weight Bearing Restrictions: Yes LLE Weight Bearing: Weight bearing as tolerated    Mobility  Bed Mobility                  Transfers Overall transfer level: Needs assistance Equipment used: Rolling walker (2 wheeled) Transfers: Sit to/from Stand Sit to Stand: Supervision         General transfer comment: supervision for safety  Ambulation/Gait Ambulation/Gait assistance: Min guard Ambulation Distance (Feet): 140 Feet Assistive device: Rolling walker (2 wheeled) Gait Pattern/deviations: Step-through pattern;Decreased stride length;Decreased step length - right;Decreased weight shift to left Gait velocity: decr   General Gait Details: Pt progressing with step through gait pattern but still encouragement for increase LLE WBing during stance phase to allow increased step length RLE.     Stairs Stairs: Yes Stairs assistance: Min guard Stair Management: One rail Right;Step to pattern;Forwards Number of Stairs: 12 General stair comments: cues for sequencing & technique.    Wheelchair Mobility    Modified Rankin  (Stroke Patients Only)       Balance                                    Cognition Arousal/Alertness: Awake/alert Behavior During Therapy: WFL for tasks assessed/performed Overall Cognitive Status: Within Functional Limits for tasks assessed                      Exercises Total Joint Exercises Ankle Circles/Pumps: AROM;Both;10 reps Quad Sets: AROM;Both;10 reps Hip ABduction/ADduction: Strengthening;AAROM;Left;10 reps Straight Leg Raises: AAROM;Strengthening;Left;10 reps Knee Flexion: AAROM;Left;Seated;10 reps (5 AAROM + 5 self AAROM)    General Comments        Pertinent Vitals/Pain Pain Assessment: 0-10 Pain Score: 4  Pain Location: Lt knee Pain Descriptors / Indicators: Discomfort Pain Intervention(s): Monitored during session;Premedicated before session;Repositioned    Home Living                      Prior Function            PT Goals (current goals can now be found in the care plan section) Acute Rehab PT Goals Patient Stated Goal: to go home tomorrow PT Goal Formulation: With patient Time For Goal Achievement: 05/02/14 Potential to Achieve Goals: Good Progress towards PT goals: Progressing toward goals    Frequency  7X/week    PT Plan Current plan remains appropriate    Co-evaluation             End of Session Equipment Utilized During Treatment: Left  knee immobilizer Activity Tolerance: Patient tolerated treatment well Patient left: in chair;with call bell/phone within reach     Time: [id number] PT Time Calculation (min) (ACUTE ONLY): 34 min  Charges:  $Gait Training: 8-22 mins $Therapeutic Exercise: 8-22 mins                    G Codes:      Lara MulchCooper, Keara Pagliarulo Lynn 04/26/2014, 11:46 AM   Verdell FaceKelly Raneem [place], PTA [phone] 04/26/2014

## 2014-04-26 NOTE — Care Management Note (Signed)
    Page 1 of 2   04/26/2014     12:41:56 PM CARE MANAGEMENT NOTE 04/26/2014  Patient:  Kara Willis, Kara Willis   Account Number:  [id number]  Date Initiated:  04/26/2014  Documentation initiated by:  [email or web address],SFKC  Subjective/Objective Assessment:   Left Total knee arthoplasty.     Action/Plan:   Case Management consult.   Anticipated DC Date:  04/27/2014   Anticipated DC Plan:  [place]  CM consult      [facility] Choice  HOME HEALTH   Choice offered to / List presented to:  C-1 Patient        [place] arranged  [facility] PT      [place].   Status of service:  In process, will continue to follow Medicare Important Message given?   (If response is "NO", the following Medicare IM given date fields will be blank) Date Medicare IM given:   Medicare IM given by:   Date Additional Medicare IM given:   Additional Medicare IM given by:    Discharge Disposition:    Per UR Regulation:    If discussed at Long Length of Stay Meetings, dates discussed:    Comments:  1.17.2016.Case Management.11.15AM  Met Patient at bedside.Role Of Case manager explained.Patient reports understanding.Patient provided with Choice list for Home health services( await home health PT orders)[facility].Patient Reports no DME needs( has a rolling walker and a bedside commode at home) Case Manager contacted [place] .Chrsitin aware we await PT home health orders and will follow along.Patient thanked Case Manager for her assistance.

## 2014-04-26 NOTE — Progress Notes (Signed)
Patient states she has walker and 3 in 1 at home and does not need any equipment for discharge. Souse to provide transportation.

## 2014-04-26 NOTE — Progress Notes (Signed)
    Patient doing well S/P L knee TKA per Dr. Jerl Santosalldorf. Minimal px, excellent gains in PT. Ready for D/C home   Physical Exam: BP 126/52 mmHg  Pulse 92  Temp(Src) 98.7 F (37.1 C) (Oral)  Resp 16  Ht 5' 7.5" (1.715 m)  Wt 92.987 kg (205 lb)  BMI 31.62 kg/m2  SpO2 91%  Dressing in place, distal compartments soft, CPM at bedside, NVI  POD #2 s/p L TKA per Dr. Jerl Santosalldorf  - up with PT/OT, encourage ambulation - Pain well controlled cont current regimen  -Scripts in chart for D/C - ASA 325 BID 2 weeks, script in chart - likely d/c home today with HHPT and CPM

## 2014-04-28 ENCOUNTER — Encounter (HOSPITAL_COMMUNITY): Payer: Self-pay | Admitting: Orthopaedic Surgery

## 2014-05-08 NOTE — Discharge Summary (Signed)
Patient ID: Kara Willis MRN: [id number] DOB/AGE: [birth year] 74 y.o.  Admit date: 04/24/2014 Discharge date: 04/26/14  Admission Diagnoses:  Principal Problem:   Primary osteoarthritis of left knee Active Problems:   Primary osteoarthritis of knee   Discharge Diagnoses:  Same  Past Medical History  Diagnosis Date  . Arthritis   . DJD (degenerative joint disease)     Surgeries: Procedure(s): LEFT TOTAL KNEE ARTHROPLASTY on 04/24/2014   Consultants:    Discharged Condition: Improved  Hospital Course: Kara Willis is an 74 y.o. female who was admitted 04/24/2014 for operative treatment ofPrimary osteoarthritis of left knee. Patient has severe unremitting pain that affects sleep, daily activities, and work/hobbies. After pre-op clearance the patient was taken to the operating room on 04/24/2014 and underwent  Procedure(s): LEFT TOTAL KNEE ARTHROPLASTY.    Patient was given perioperative antibiotics:  Anti-infectives    Start     Dose/Rate Route Frequency Ordered Stop   04/24/14 2200  ceFAZolin (ANCEF) IVPB 2 g/50 mL premix     2 g100 mL/hr over 30 Minutes Intravenous Every 6 hours 04/24/14 1817 04/25/14 0515   04/24/14 0600  ceFAZolin (ANCEF) IVPB 2 g/50 mL premix     2 g100 mL/hr over 30 Minutes Intravenous On call to O.R. 04/23/14 1401 04/24/14 1436       Patient was given sequential compression devices, early ambulation, and chemoprophylaxis to prevent DVT.  Patient benefited maximally from hospital stay and there were no complications.    Recent vital signs: No data found.    Recent laboratory studies: No results for input(s): WBC, HGB, HCT, PLT, NA, K, CL, CO2, BUN, CREATININE, GLUCOSE, INR, CALCIUM in the last 72 hours.  Invalid input(s): PT, 2   Discharge Medications:     Medication List    STOP taking these medications        aspirin 81 MG tablet  Replaced by:  aspirin EC 325 MG tablet     celecoxib 200 MG capsule  Commonly known as:   CELEBREX      TAKE these medications        aspirin EC 325 MG tablet  Take 1 tablet (325 mg total) by mouth 2 (two) times daily at 8 am and 10 pm.     CALCIUM-VITAMIN D PO  Take 1 tablet by mouth daily.     HYDROcodone-acetaminophen 5-325 MG per tablet  Commonly known as:  NORCO  Take 1-2 tablets by mouth every 4 (four) hours as needed for moderate pain.     methocarbamol 500 MG tablet  Commonly known as:  ROBAXIN  Take 1 tablet (500 mg total) by mouth every 6 (six) hours as needed for muscle spasms.     pravastatin 40 MG tablet  Commonly known as:  PRAVACHOL  Take 40 mg by mouth daily.        Diagnostic Studies: Dg Chest 2 View  04/08/2014   CLINICAL DATA:  Arthroplasty.  EXAM: CHEST  2 VIEW  COMPARISON:  No prior.  FINDINGS: Mediastinal structures normal. Lungs are clear. Calcified nodule right lung consistent with granuloma. No pleural effusion or pneumothorax. No acute bony abnormality .  IMPRESSION: No active cardiopulmonary disease.   Electronically Signed   By: Maisie Fushomas  Register   On: 04/08/2014 09:32    Disposition: 01-Home or Self Care      Discharge Instructions    Ambulatory referral to Home Health    Complete by:  As directed   Please evaluate Kara Willis  for admission to Home Health.3  Disciplines requested: Physical Therapy  Services to provide: Strengthening Exercises  Physician to follow patient's care (the person listed here will be responsible for signing ongoing orders): Other: Dr Jerl Santos  Requested Start of Care Date: Tomorrow  I certify that this patient is under my care and that I, or a Nurse Practitioner or Physician's Assistant working with me, had a face-to-face encounter that meets the physician face-to-face requirements with patient on 04/26/2014. The encounter with the patient was in whole, or in part for the following medical condition(s) which is the primary reason for home health care (List medical condition). l TKR  Special  Instructions:  CPM  Does the patient have Medicare or Medicaid?:  Yes  The encounter with the patient was in whole, or in part, for the following medical condition, which is the primary reason for home health care:  L TKR  Reason for Medically Necessary Home Health Services:  Therapy- Investment banker, operational, Patent examiner  My clinical findings support the need for the above services:  Unable to leave home safely without assistance and/or assistive device  I certify that, based on my findings, the following services are medically necessary home health services:  Physical therapy  Further, I certify that my clinical findings support that this patient is homebound due to:  Unable to leave home safely without assistance           Follow-up Information    Follow up with DALLDORF,PETER G, MD. Schedule an appointment as soon as possible for a visit in 2 weeks.   Specialty:  Orthopedic Surgery   Contact information:   [address] [place] [address] [phone]        Signed: Drema Halon 05/08/2014, 8:59 AM

## 2014-07-15 ENCOUNTER — Other Ambulatory Visit: Payer: Self-pay

## 2014-07-15 DIAGNOSIS — Z1231 Encounter for screening mammogram for malignant neoplasm of breast: Secondary | ICD-10-CM

## 2014-07-23 ENCOUNTER — Ambulatory Visit
Admission: RE | Admit: 2014-07-23 | Discharge: 2014-07-23 | Disposition: A | Discharge status: Home / Self Care | Payer: Medicare PPO | Source: Ambulatory Visit

## 2014-07-23 DIAGNOSIS — Z1231 Encounter for screening mammogram for malignant neoplasm of breast: Secondary | ICD-10-CM

## 2014-10-05 ENCOUNTER — Other Ambulatory Visit: Payer: Self-pay

## 2015-03-29 ENCOUNTER — Other Ambulatory Visit: Payer: Self-pay | Admitting: Orthopaedic Surgery

## 2015-04-08 NOTE — Pre-Procedure Instructions (Signed)
    Lorraine Laxlizabeth D Renteria  04/08/2015      [id number] - [place], [place] - [address] [place] [id number] Phone: [phone] Fax: [phone]  [id number] [place]- [place], [place] - [address] [facility] [place] [address] [place] [id number] Phone: [phone] Fax: [phone]    Your procedure is scheduled on Tuesday, January 10.  [facility] 5:30A.M.               Your surgery or procedure is scheduled for 7:30 AM   Call this number if you have problems the morning of surgery:[phone]                For any other questions, please call [phone], Monday - Friday 8 AM - 4 PM.   Remember:  Do not eat food or drink liquids after midnight Monday, January 9.  Take these medicines the morning of surgery with A SIP OF WATER :- NONE  (stop ASPIRIN, CELEBREX)   Do not wear jewelry, make-up or nail polish.  Do not wear lotions, powders, or perfumes.   Do not shave 48 hours prior to surgery.    Do not bring valuables to the hospital.  [facility] is not responsible for any belongings or valuables.  Contacts, dentures or bridgework may not be worn into surgery.  Leave your suitcase in the car.  After surgery it may be brought to your room.  For patients admitted to the hospital, discharge time will be determined by your treatment team.  Patients discharged the day of surgery will not be allowed to drive home.   Name and phone number of your driver:   -  Special instructions:  -  Please read over the following fact sheets that you were given. Pain Booklet, Coughing and Deep Breathing, Blood Transfusion Information, MRSA Information and Surgical Site Infection Prevention and Incentive Spirometry

## 2015-04-09 ENCOUNTER — Encounter (HOSPITAL_COMMUNITY)
Admission: RE | Admit: 2015-04-09 | Discharge: 2015-04-09 | Disposition: A | Discharge status: Home / Self Care | Payer: Medicare PPO | Source: Ambulatory Visit | Attending: Orthopaedic Surgery | Admitting: Orthopaedic Surgery

## 2015-04-09 ENCOUNTER — Encounter (HOSPITAL_COMMUNITY): Payer: Self-pay

## 2015-04-09 ENCOUNTER — Ambulatory Visit (HOSPITAL_COMMUNITY)
Admission: RE | Admit: 2015-04-09 | Discharge: 2015-04-09 | Disposition: A | Discharge status: Home / Self Care | Payer: Medicare PPO | Source: Ambulatory Visit | Attending: Orthopaedic Surgery | Admitting: Orthopaedic Surgery

## 2015-04-09 DIAGNOSIS — Z01818 Encounter for other preprocedural examination: Secondary | ICD-10-CM | POA: Diagnosis present

## 2015-04-09 DIAGNOSIS — Z01810 Encounter for preprocedural cardiovascular examination: Secondary | ICD-10-CM | POA: Insufficient documentation

## 2015-04-09 DIAGNOSIS — Z01812 Encounter for preprocedural laboratory examination: Secondary | ICD-10-CM | POA: Diagnosis not present

## 2015-04-09 LAB — URINALYSIS, ROUTINE W REFLEX MICROSCOPIC
Bilirubin Urine: NEGATIVE
Glucose, UA: NEGATIVE mg/dL
HGB URINE DIPSTICK: NEGATIVE
Ketones, ur: NEGATIVE mg/dL
Leukocytes, UA: NEGATIVE
Nitrite: NEGATIVE
PH: 6.5 (ref 5.0–8.0)
Protein, ur: NEGATIVE mg/dL
SPECIFIC GRAVITY, URINE: 1.007 (ref 1.005–1.030)

## 2015-04-09 LAB — CBC WITH DIFFERENTIAL/PLATELET
BASOS ABS: 0 10*3/uL (ref 0.0–0.1)
BASOS PCT: 1 %
EOS ABS: 0.2 10*3/uL (ref 0.0–0.7)
EOS PCT: 4 %
HCT: 36.9 % (ref 36.0–46.0)
Hemoglobin: 12.1 g/dL (ref 12.0–15.0)
LYMPHS PCT: 39 %
Lymphs Abs: 2.1 10*3/uL (ref 0.7–4.0)
MCH: 31.8 pg (ref 26.0–34.0)
MCHC: 32.8 g/dL (ref 30.0–36.0)
MCV: 96.9 fL (ref 78.0–100.0)
MONO ABS: 0.4 10*3/uL (ref 0.1–1.0)
Monocytes Relative: 8 %
NEUTROS PCT: 48 %
Neutro Abs: 2.6 10*3/uL (ref 1.7–7.7)
PLATELETS: 248 10*3/uL (ref 150–400)
RBC: 3.81 MIL/uL — AB (ref 3.87–5.11)
RDW: 12.5 % (ref 11.5–15.5)
WBC: 5.3 10*3/uL (ref 4.0–10.5)

## 2015-04-09 LAB — BASIC METABOLIC PANEL
ANION GAP: 7 (ref 5–15)
BUN: 14 mg/dL (ref 6–20)
CHLORIDE: 105 mmol/L (ref 101–111)
CO2: 29 mmol/L (ref 22–32)
Calcium: 9.2 mg/dL (ref 8.9–10.3)
Creatinine, Ser: 0.78 mg/dL (ref 0.44–1.00)
GFR calc non Af Amer: >60 mL/min (ref >60)
Glucose, Bld: 81 mg/dL (ref 65–99)
POTASSIUM: 4.2 mmol/L (ref 3.5–5.1)
Sodium: 141 mmol/L (ref 135–145)

## 2015-04-09 LAB — APTT: aPTT: 26 seconds (ref 24–37)

## 2015-04-09 LAB — SURGICAL PCR SCREEN
MRSA, PCR: NEGATIVE
STAPHYLOCOCCUS AUREUS: NEGATIVE

## 2015-04-09 LAB — TYPE AND SCREEN
ABO/RH(D): A POS
Antibody Screen: NEGATIVE

## 2015-04-09 LAB — PROTIME-INR
INR: 0.9 (ref 0.00–1.49)
PROTHROMBIN TIME: 12.4 s (ref 11.6–15.2)

## 2015-04-14 NOTE — H&P (Signed)
TOTAL KNEE ADMISSION H&P  Patient is being admitted for right total knee arthroplasty.  Subjective:  Chief Complaint:right knee pain.  HPI: Kara Willis, 75 y.o. female, has a history of pain and functional disability in the right knee due to arthritis and has failed non-surgical conservative treatments for greater than 12 weeks to includeNSAID's and/or analgesics, corticosteriod injections, viscosupplementation injections, flexibility and strengthening excercises, use of assistive devices, weight reduction as appropriate and activity modification.  Onset of symptoms was gradual, starting 5 years ago with gradually worsening course since that time. The patient noted no past surgery on the right knee(s).  Patient currently rates pain in the right knee(s) at 10 out of 10 with activity. Patient has night pain, worsening of pain with activity and weight bearing, pain that interferes with activities of daily living, crepitus and joint swelling.  Patient has evidence of subchondral cysts, subchondral sclerosis, periarticular osteophytes and joint space narrowing by imaging studies. There is no active infection.  Patient Active Problem List   Diagnosis Date Noted  . Primary osteoarthritis of left knee 04/24/2014  . Primary osteoarthritis of knee 04/24/2014   Past Medical History  Diagnosis Date  . Arthritis   . DJD (degenerative joint disease)     Past Surgical History  Procedure Laterality Date  . Appendectomy    . Colonoscopy    . Total knee arthroplasty Left 04/24/2014    Procedure: LEFT TOTAL KNEE ARTHROPLASTY;  Surgeon: Velna OchsPeter G Dalldorf, MD;  Location: [facility] OR;  Service: Orthopedics;  Laterality: Left;  . Dilation and curettage of uterus    . Tonsillectomy      No prescriptions prior to admission   No Known Allergies  Social History  Substance Use Topics  . Smoking status: Never Smoker   . Smokeless tobacco: Not on file  . Alcohol Use: 4.2 oz/week    7 Glasses of wine per week     No family history on file.   Review of Systems  Musculoskeletal: Positive for joint pain.       Right knee  All other systems reviewed and are negative.   Objective:  Physical Exam  Constitutional: She is oriented to person, place, and time. She appears well-developed and well-nourished.  HENT:  Head: Normocephalic and atraumatic.  Eyes: Conjunctivae are normal. Pupils are equal, round, and reactive to light.  Neck: Normal range of motion.  Cardiovascular: Normal rate and regular rhythm.   Respiratory: Effort normal.  GI: Soft.  Musculoskeletal:  Both knees move about 0-115.  On the right she has medial joint line pain and crepitation with no effusion.  Hip motion is full and straight leg raise is negative.  Sensation and motor function are intact in her feet with palpable pulses on both sides.   Neurological: She is alert and oriented to person, place, and time.  Skin: Skin is warm and dry.  Psychiatric: She has a normal mood and affect. Her behavior is normal. Judgment and thought content normal.    Vital signs in last 24 hours:    Labs:   Estimated body mass index is 31.74 kg/(m^2) as calculated from the following:   Height as of 04/24/14: 5' 7.5" (1.715 m).   Weight as of 04/08/14: 93.35 kg (205 lb 12.8 oz).   Imaging Review Plain radiographs demonstrate severe degenerative joint disease of the right knee(s). The overall alignment isneutral. The bone quality appears to be good for age and reported activity level.  Assessment/Plan:  End stage primary arthritis,  right knee   The patient history, physical examination, clinical judgment of the provider and imaging studies are consistent with end stage degenerative joint disease of the right knee(s) and total knee arthroplasty is deemed medically necessary. The treatment options including medical management, injection therapy arthroscopy and arthroplasty were discussed at length. The risks and benefits of total knee  arthroplasty were presented and reviewed. The risks due to aseptic loosening, infection, stiffness, patella tracking problems, thromboembolic complications and other imponderables were discussed. The patient acknowledged the explanation, agreed to proceed with the plan and consent was signed. Patient is being admitted for inpatient treatment for surgery, pain control, PT, OT, prophylactic antibiotics, VTE prophylaxis, progressive ambulation and ADL's and discharge planning. The patient is planning to be discharged home with home health services

## 2015-04-19 MED ORDER — CHLORHEXIDINE GLUCONATE 4 % EX LIQD: 60.0000 mL | Freq: Once | CUTANEOUS | Status: DC

## 2015-04-19 MED ORDER — CEFAZOLIN SODIUM-DEXTROSE 2-3 GM-% IV SOLR
2.0000 g | INTRAVENOUS | Status: AC
  Administered 2015-04-20: 2 g via INTRAVENOUS
  Filled 2015-04-19: qty 50

## 2015-04-19 MED ORDER — LACTATED RINGERS IV SOLN
INTRAVENOUS | Status: DC
  Administered 2015-04-20 (×2): via INTRAVENOUS

## 2015-04-19 NOTE — Anesthesia Preprocedure Evaluation (Addendum)
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, none to compare to   Neuro/Psych    GI/Hepatic negative GI ROS, Neg liver ROS,   Endo/Other  negative endocrine ROS  Renal/GU negative Renal ROSGFR 80     Musculoskeletal   Abdominal   Peds  Hematology 12/37   Anesthesia Other Findings   Reproductive/Obstetrics                             Anesthesia Physical  Anesthesia Plan  ASA: III  Anesthesia Plan: Spinal   Post-op Pain Management: MAC Combined w/ Regional for Post-op pain   Induction: Intravenous  Airway Management Planned: Oral ETT and LMA  Additional Equipment:   Intra-op Plan:   Post-operative Plan: Extubation in OR  Informed Consent: I have reviewed the patients History and Physical, chart, labs and discussed the procedure including the risks, benefits and alternatives for the proposed anesthesia with the patient or authorized representative who has indicated his/her understanding and acceptance.   Dental advisory given  Plan Discussed with: CRNA and Anesthesiologist  Anesthesia Plan Comments:         Anesthesia Quick Evaluation

## 2015-04-20 ENCOUNTER — Inpatient Hospital Stay (HOSPITAL_COMMUNITY)
Admission: RE | Admit: 2015-04-20 | Discharge: 2015-04-22 | DRG: 470 | Disposition: A | Discharge status: Home Health | Payer: Medicare Other | Source: Ambulatory Visit | Attending: Orthopaedic Surgery | Admitting: Orthopaedic Surgery

## 2015-04-20 ENCOUNTER — Encounter (HOSPITAL_COMMUNITY): Payer: Self-pay | Admitting: General Practice

## 2015-04-20 ENCOUNTER — Encounter (HOSPITAL_COMMUNITY)
Admission: RE | Disposition: A | Discharge status: Home Health | Payer: Self-pay | Source: Ambulatory Visit | Attending: Orthopaedic Surgery

## 2015-04-20 ENCOUNTER — Inpatient Hospital Stay (HOSPITAL_COMMUNITY): Payer: Medicare Other | Admitting: Emergency Medicine

## 2015-04-20 ENCOUNTER — Inpatient Hospital Stay (HOSPITAL_COMMUNITY): Payer: Medicare Other | Admitting: Anesthesiology

## 2015-04-20 DIAGNOSIS — M1711 Unilateral primary osteoarthritis, right knee: Secondary | ICD-10-CM | POA: Diagnosis present

## 2015-04-20 DIAGNOSIS — Z96652 Presence of left artificial knee joint: Secondary | ICD-10-CM | POA: Diagnosis present

## 2015-04-20 HISTORY — PX: TOTAL KNEE ARTHROPLASTY: SHX125

## 2015-04-20 SURGERY — ARTHROPLASTY, KNEE, TOTAL
Anesthesia: Spinal | Laterality: Right

## 2015-04-20 MED ORDER — LIDOCAINE HCL (CARDIAC) 20 MG/ML IV SOLN
INTRAVENOUS | Status: DC | PRN
  Administered 2015-04-20: 60 mg via INTRAVENOUS

## 2015-04-20 MED ORDER — BUPIVACAINE LIPOSOME 1.3 % IJ SUSP
20.0000 mL | INTRAMUSCULAR | Status: DC
  Filled 2015-04-20: qty 20

## 2015-04-20 MED ORDER — ACETAMINOPHEN 650 MG RE SUPP: 650.0000 mg | Freq: Four times a day (QID) | RECTAL | Status: DC | PRN

## 2015-04-20 MED ORDER — PROPOFOL 10 MG/ML IV BOLUS
INTRAVENOUS | Status: AC
  Filled 2015-04-20: qty 20

## 2015-04-20 MED ORDER — BUPIVACAINE-EPINEPHRINE (PF) 0.5% -1:200000 IJ SOLN
INTRAMUSCULAR | Status: DC | PRN
  Administered 2015-04-20: 20 mL via PERINEURAL

## 2015-04-20 MED ORDER — ASPIRIN EC 325 MG PO TBEC
325.0000 mg | DELAYED_RELEASE_TABLET | Freq: Two times a day (BID) | ORAL | Status: DC
  Administered 2015-04-21 – 2015-04-22 (×3): 325 mg via ORAL
  Filled 2015-04-20 (×3): qty 1

## 2015-04-20 MED ORDER — ONDANSETRON HCL 4 MG/2ML IJ SOLN: 4.0000 mg | Freq: Four times a day (QID) | INTRAMUSCULAR | Status: DC | PRN

## 2015-04-20 MED ORDER — ALUM & MAG HYDROXIDE-SIMETH 200-200-20 MG/5ML PO SUSP: 30.0000 mL | ORAL | Status: DC | PRN

## 2015-04-20 MED ORDER — BUPIVACAINE HCL (PF) 0.5 % IJ SOLN
INTRAMUSCULAR | Status: AC
  Filled 2015-04-20: qty 30

## 2015-04-20 MED ORDER — ONDANSETRON HCL 4 MG/2ML IJ SOLN
INTRAMUSCULAR | Status: DC | PRN
  Administered 2015-04-20: 4 mg via INTRAVENOUS

## 2015-04-20 MED ORDER — ONDANSETRON HCL 4 MG PO TABS: 4.0000 mg | ORAL_TABLET | Freq: Four times a day (QID) | ORAL | Status: DC | PRN

## 2015-04-20 MED ORDER — METHOCARBAMOL 1000 MG/10ML IJ SOLN: 500.0000 mg | Freq: Four times a day (QID) | INTRAVENOUS | Status: DC | PRN

## 2015-04-20 MED ORDER — PRAVASTATIN SODIUM 40 MG PO TABS
40.0000 mg | ORAL_TABLET | Freq: Every day | ORAL | Status: DC
  Administered 2015-04-20 – 2015-04-22 (×3): 40 mg via ORAL
  Filled 2015-04-20 (×3): qty 1

## 2015-04-20 MED ORDER — METOCLOPRAMIDE HCL 5 MG PO TABS: 5.0000 mg | ORAL_TABLET | Freq: Three times a day (TID) | ORAL | Status: DC | PRN

## 2015-04-20 MED ORDER — BISACODYL 5 MG PO TBEC: 5.0000 mg | DELAYED_RELEASE_TABLET | Freq: Every day | ORAL | Status: DC | PRN

## 2015-04-20 MED ORDER — FENTANYL CITRATE (PF) 250 MCG/5ML IJ SOLN
INTRAMUSCULAR | Status: AC
  Filled 2015-04-20: qty 5

## 2015-04-20 MED ORDER — METHOCARBAMOL 500 MG PO TABS
500.0000 mg | ORAL_TABLET | Freq: Four times a day (QID) | ORAL | Status: DC | PRN
  Administered 2015-04-20 – 2015-04-22 (×4): 500 mg via ORAL
  Filled 2015-04-20 (×4): qty 1

## 2015-04-20 MED ORDER — LATANOPROST 0.005 % OP SOLN
1.0000 [drp] | Freq: Every day | OPHTHALMIC | Status: DC
  Administered 2015-04-20 – 2015-04-21 (×2): 1 [drp] via OPHTHALMIC
  Filled 2015-04-20: qty 2.5

## 2015-04-20 MED ORDER — FENTANYL CITRATE (PF) 100 MCG/2ML IJ SOLN
INTRAMUSCULAR | Status: DC | PRN
  Administered 2015-04-20 (×2): 50 ug via INTRAVENOUS

## 2015-04-20 MED ORDER — MIDAZOLAM HCL 2 MG/2ML IJ SOLN
INTRAMUSCULAR | Status: AC
  Filled 2015-04-20: qty 2

## 2015-04-20 MED ORDER — BUPIVACAINE LIPOSOME 1.3 % IJ SUSP
INTRAMUSCULAR | Status: DC | PRN
  Administered 2015-04-20: 40 mL

## 2015-04-20 MED ORDER — PROPOFOL 500 MG/50ML IV EMUL
INTRAVENOUS | Status: DC | PRN
  Administered 2015-04-20: 75 ug/kg/min via INTRAVENOUS

## 2015-04-20 MED ORDER — MIDAZOLAM HCL 5 MG/5ML IJ SOLN
INTRAMUSCULAR | Status: DC | PRN
  Administered 2015-04-20: 2 mg via INTRAVENOUS

## 2015-04-20 MED ORDER — SODIUM CHLORIDE 0.9 % IV SOLN
10.0000 mg | INTRAVENOUS | Status: DC | PRN
  Administered 2015-04-20: 10 ug/min via INTRAVENOUS

## 2015-04-20 MED ORDER — CEFAZOLIN SODIUM-DEXTROSE 2-3 GM-% IV SOLR
2.0000 g | Freq: Four times a day (QID) | INTRAVENOUS | Status: AC
  Administered 2015-04-20 (×2): 2 g via INTRAVENOUS
  Filled 2015-04-20 (×3): qty 50

## 2015-04-20 MED ORDER — BUPIVACAINE-EPINEPHRINE (PF) 0.5% -1:200000 IJ SOLN
INTRAMUSCULAR | Status: DC | PRN
  Administered 2015-04-20: 23 mL

## 2015-04-20 MED ORDER — DIPHENHYDRAMINE HCL 12.5 MG/5ML PO ELIX: 12.5000 mg | ORAL_SOLUTION | ORAL | Status: DC | PRN

## 2015-04-20 MED ORDER — ACETAMINOPHEN 325 MG PO TABS: 650.0000 mg | ORAL_TABLET | Freq: Four times a day (QID) | ORAL | Status: DC | PRN

## 2015-04-20 MED ORDER — SODIUM CHLORIDE 0.9 % IR SOLN
Status: DC | PRN
Start: 2015-04-20 — End: 2015-04-20
  Administered 2015-04-20 (×2): 1000 mL

## 2015-04-20 MED ORDER — BUPIVACAINE IN DEXTROSE 0.75-8.25 % IT SOLN
INTRATHECAL | Status: DC | PRN
  Administered 2015-04-20: 2 mL via INTRATHECAL

## 2015-04-20 MED ORDER — LIDOCAINE HCL (CARDIAC) 20 MG/ML IV SOLN
INTRAVENOUS | Status: AC
  Filled 2015-04-20: qty 5

## 2015-04-20 MED ORDER — PHENOL 1.4 % MT LIQD: 1.0000 | OROMUCOSAL | Status: DC | PRN

## 2015-04-20 MED ORDER — PROPOFOL 10 MG/ML IV BOLUS
INTRAVENOUS | Status: DC | PRN
  Administered 2015-04-20: 20 mg via INTRAVENOUS
  Administered 2015-04-20: 30 mg via INTRAVENOUS

## 2015-04-20 MED ORDER — MENTHOL 3 MG MT LOZG: 1.0000 | LOZENGE | OROMUCOSAL | Status: DC | PRN

## 2015-04-20 MED ORDER — HYDROCODONE-ACETAMINOPHEN 5-325 MG PO TABS
1.0000 | ORAL_TABLET | ORAL | Status: DC | PRN
  Administered 2015-04-20: 1 via ORAL
  Administered 2015-04-20 – 2015-04-22 (×6): 2 via ORAL
  Filled 2015-04-20 (×7): qty 2

## 2015-04-20 MED ORDER — 0.9 % SODIUM CHLORIDE (POUR BTL) OPTIME
TOPICAL | Status: DC | PRN
  Administered 2015-04-20: 1000 mL

## 2015-04-20 MED ORDER — BUPIVACAINE-EPINEPHRINE (PF) 0.5% -1:200000 IJ SOLN
INTRAMUSCULAR | Status: AC
  Filled 2015-04-20: qty 30

## 2015-04-20 MED ORDER — HYDROMORPHONE HCL 1 MG/ML IJ SOLN: 0.5000 mg | INTRAMUSCULAR | Status: DC | PRN

## 2015-04-20 MED ORDER — PROMETHAZINE HCL 25 MG/ML IJ SOLN: 6.2500 mg | INTRAMUSCULAR | Status: DC | PRN

## 2015-04-20 MED ORDER — HYDROMORPHONE HCL 1 MG/ML IJ SOLN: 0.2500 mg | INTRAMUSCULAR | Status: DC | PRN

## 2015-04-20 MED ORDER — MEPERIDINE HCL 25 MG/ML IJ SOLN: 6.2500 mg | INTRAMUSCULAR | Status: DC | PRN

## 2015-04-20 MED ORDER — LACTATED RINGERS IV SOLN
INTRAVENOUS | Status: DC
  Administered 2015-04-20: 13:00:00 via INTRAVENOUS

## 2015-04-20 MED ORDER — TRANEXAMIC ACID 1000 MG/10ML IV SOLN
1000.0000 mg | INTRAVENOUS | Status: AC
  Administered 2015-04-20: 1000 mg via INTRAVENOUS
  Filled 2015-04-20: qty 10

## 2015-04-20 MED ORDER — DOCUSATE SODIUM 100 MG PO CAPS
100.0000 mg | ORAL_CAPSULE | Freq: Two times a day (BID) | ORAL | Status: DC
  Administered 2015-04-20 – 2015-04-22 (×4): 100 mg via ORAL
  Filled 2015-04-20 (×4): qty 1

## 2015-04-20 MED ORDER — METOCLOPRAMIDE HCL 5 MG/ML IJ SOLN: 5.0000 mg | Freq: Three times a day (TID) | INTRAMUSCULAR | Status: DC | PRN

## 2015-04-20 SURGICAL SUPPLY — 72 items
APL SKNCLS STERI-STRIP NONHPOA (GAUZE/BANDAGES/DRESSINGS) ×1
BAG DECANTER FOR FLEXI CONT (MISCELLANEOUS) IMPLANT
BANDAGE ACE 6X5 VEL STRL LF (GAUZE/BANDAGES/DRESSINGS) ×2 IMPLANT
BANDAGE ELASTIC 4 VELCRO ST LF (GAUZE/BANDAGES/DRESSINGS) ×3 IMPLANT
BANDAGE ESMARK 6X9 LF (GAUZE/BANDAGES/DRESSINGS) ×1 IMPLANT
BANDAGE GAUZE 4  KLING STR (GAUZE/BANDAGES/DRESSINGS) ×2 IMPLANT
BENZOIN TINCTURE PRP APPL 2/3 (GAUZE/BANDAGES/DRESSINGS) ×3 IMPLANT
BLADE SAGITTAL 25.0X1.19X90 (BLADE) ×1 IMPLANT
BLADE SAGITTAL 25.0X1.19X90MM (BLADE) ×1
BLADE SAW SGTL 13.0X1.19X90.0M (BLADE) IMPLANT
BLADE SURG ROTATE 9660 (MISCELLANEOUS) IMPLANT
BNDG CMPR 9X6 STRL LF SNTH (GAUZE/BANDAGES/DRESSINGS) ×1
BNDG CMPR MED 10X6 ELC LF (GAUZE/BANDAGES/DRESSINGS) ×1
BNDG ELASTIC 6X10 VLCR STRL LF (GAUZE/BANDAGES/DRESSINGS) ×3 IMPLANT
BNDG ESMARK 6X9 LF (GAUZE/BANDAGES/DRESSINGS) ×3
BNDG GAUZE ELAST 4 BULKY (GAUZE/BANDAGES/DRESSINGS) ×6 IMPLANT
BOWL SMART MIX CTS (DISPOSABLE) ×3 IMPLANT
CAP KNEE TOTAL 3 SIGMA ×2 IMPLANT
CEMENT HV SMART SET (Cement) ×6 IMPLANT
CLOSURE STERI-STRIP 1/2X4 (GAUZE/BANDAGES/DRESSINGS) ×1
CLOSURE WOUND 1/2 X4 (GAUZE/BANDAGES/DRESSINGS) ×1
CLSR STERI-STRIP ANTIMIC 1/2X4 (GAUZE/BANDAGES/DRESSINGS) ×1 IMPLANT
COVER SURGICAL LIGHT HANDLE (MISCELLANEOUS) ×3 IMPLANT
CUFF TOURNIQUET SINGLE 34IN LL (TOURNIQUET CUFF) ×3 IMPLANT
CUFF TOURNIQUET SINGLE 44IN (TOURNIQUET CUFF) IMPLANT
DECANTER SPIKE VIAL GLASS SM (MISCELLANEOUS) ×3 IMPLANT
DRAPE EXTREMITY T 121X128X90 (DRAPE) ×3 IMPLANT
DRAPE PROXIMA HALF (DRAPES) ×3 IMPLANT
DRAPE U-SHAPE 47X51 STRL (DRAPES) ×3 IMPLANT
DRSG ADAPTIC 3X8 NADH LF (GAUZE/BANDAGES/DRESSINGS) ×3 IMPLANT
DRSG PAD ABDOMINAL 8X10 ST (GAUZE/BANDAGES/DRESSINGS) ×3 IMPLANT
DURAPREP 26ML APPLICATOR (WOUND CARE) ×3 IMPLANT
ELECT REM PT RETURN 9FT ADLT (ELECTROSURGICAL) ×3
ELECTRODE REM PT RTRN 9FT ADLT (ELECTROSURGICAL) ×1 IMPLANT
GAUZE SPONGE 4X4 12PLY STRL (GAUZE/BANDAGES/DRESSINGS) ×3 IMPLANT
GLOVE BIO SURGEON STRL SZ8 (GLOVE) ×6 IMPLANT
GLOVE BIOGEL PI IND STRL 8 (GLOVE) ×2 IMPLANT
GLOVE BIOGEL PI INDICATOR 8 (GLOVE) ×4
GOWN STRL REUS W/ TWL LRG LVL3 (GOWN DISPOSABLE) ×1 IMPLANT
GOWN STRL REUS W/ TWL XL LVL3 (GOWN DISPOSABLE) ×2 IMPLANT
GOWN STRL REUS W/TWL LRG LVL3 (GOWN DISPOSABLE) ×3
GOWN STRL REUS W/TWL XL LVL3 (GOWN DISPOSABLE) ×6
HANDPIECE INTERPULSE COAX TIP (DISPOSABLE) ×3
HOOD PEEL AWAY FACE SHEILD DIS (HOOD) ×8 IMPLANT
IMMOBILIZER KNEE 22 UNIV (SOFTGOODS) ×3 IMPLANT
KIT BASIN OR (CUSTOM PROCEDURE TRAY) ×3 IMPLANT
KIT ROOM TURNOVER OR (KITS) ×3 IMPLANT
MANIFOLD NEPTUNE II (INSTRUMENTS) ×3 IMPLANT
NDL HYPO 21X1 ECLIPSE (NEEDLE) ×1 IMPLANT
NEEDLE HYPO 21X1 ECLIPSE (NEEDLE) ×3 IMPLANT
NS IRRIG 1000ML POUR BTL (IV SOLUTION) ×3 IMPLANT
PACK TOTAL JOINT (CUSTOM PROCEDURE TRAY) ×3 IMPLANT
PACK UNIVERSAL I (CUSTOM PROCEDURE TRAY) ×3 IMPLANT
PAD ABD 8X10 STRL (GAUZE/BANDAGES/DRESSINGS) ×2 IMPLANT
PAD ARMBOARD 7.5X6 YLW CONV (MISCELLANEOUS) ×6 IMPLANT
SET HNDPC FAN SPRY TIP SCT (DISPOSABLE) ×1 IMPLANT
SPONGE GAUZE 4X4 12PLY STER LF (GAUZE/BANDAGES/DRESSINGS) ×2 IMPLANT
STAPLER VISISTAT 35W (STAPLE) IMPLANT
STRIP CLOSURE SKIN 1/2X4 (GAUZE/BANDAGES/DRESSINGS) ×2 IMPLANT
SUCTION FRAZIER TIP 10 FR DISP (SUCTIONS) IMPLANT
SUT MNCRL AB 3-0 PS2 18 (SUTURE) ×2 IMPLANT
SUT VIC AB 0 CT1 27 (SUTURE) ×6
SUT VIC AB 0 CT1 27XBRD ANBCTR (SUTURE) ×2 IMPLANT
SUT VIC AB 2-0 CT1 27 (SUTURE) ×6
SUT VIC AB 2-0 CT1 TAPERPNT 27 (SUTURE) ×2 IMPLANT
SUT VLOC 180 0 24IN GS25 (SUTURE) ×3 IMPLANT
SYR 50ML LL SCALE MARK (SYRINGE) ×3 IMPLANT
TOWEL OR 17X24 6PK STRL BLUE (TOWEL DISPOSABLE) ×3 IMPLANT
TOWEL OR 17X26 10 PK STRL BLUE (TOWEL DISPOSABLE) ×3 IMPLANT
TRAY FOLEY CATH 14FR (SET/KITS/TRAYS/PACK) IMPLANT
UPCHARGE REV TRAY MBT KNEE ×2 IMPLANT
WATER STERILE IRR 1000ML POUR (IV SOLUTION) ×6 IMPLANT

## 2015-04-20 NOTE — Progress Notes (Signed)
Referred to CSW today for ?SNF. Chart reviewed and [facility]  CSW to sign off- please contact us if SW needs arise. Reece LevyJanet Meliss Fleek, MSW, Theresia MajorsLCSWA [phone]

## 2015-04-20 NOTE — Transfer of Care (Signed)
Immediate Anesthesia Transfer of Care Note  Patient: Kara Willis  Procedure(s) Performed: Procedure(s): TOTAL KNEE ARTHROPLASTY (Right)  Patient Location: PACU  Anesthesia Type:Spinal  Level of Consciousness: awake, alert , oriented and patient cooperative  Airway & Oxygen Therapy: Patient Spontanous Breathing and Patient connected to nasal cannula oxygen  Post-op Assessment: Report given to RN and Post -op Vital signs reviewed and stable  Post vital signs: Reviewed and stable  Last Vitals:  Filed Vitals:   04/20/15 0603 04/20/15 0935  BP:    Pulse:    Temp: 36.8 C 36.4 C  Resp:      Complications: No apparent anesthesia complications

## 2015-04-20 NOTE — Progress Notes (Signed)
Utilization review completed.  

## 2015-04-20 NOTE — Interval H&P Note (Signed)
OK for surgery PD 

## 2015-04-20 NOTE — Anesthesia Procedure Notes (Addendum)
Anesthesia Regional Block:  Femoral nerve block  Pre-Anesthetic Checklist: ,, timeout performed, Correct Patient, Correct Site, Correct Laterality, Correct Procedure, Correct Position, site marked, Risks and benefits discussed,  Surgical consent,  Pre-op evaluation,  At surgeon's request and post-op pain management  Laterality: Right  Prep: Maximum Sterile Barrier Precautions used and chloraprep       Needles:  Injection technique: Single-shot  Needle Type: Echogenic Stimulator Needle     Needle Length: 10cm 10 cm Needle Gauge: 22 and 22 G    Additional Needles:  Procedures: ultrasound guided (picture in chart) Femoral nerve block Narrative:  Start time: 04/20/2015 7:13 AM End time: 04/20/2015 7:26 AM Injection made incrementally with aspirations every 5 mL.  Performed by: Personally  Anesthesiologist: Alexis Frock  Additional Notes: R Femoral nerve block with 23cc .5% marcaine, US guided   Spinal Patient location during procedure: OR Start time: 04/20/2015 7:22 AM End time: 04/20/2015 7:36 AM Staffing Anesthesiologist: Alexis Frock Preanesthetic Checklist Completed: patient identified, site marked, surgical consent, pre-op evaluation, timeout performed, IV checked, risks and benefits discussed and monitors and equipment checked Spinal Block Patient position: sitting Prep: site prepped and draped and DuraPrep Patient monitoring: heart rate, continuous pulse ox and blood pressure Approach: midline Location: L3-4 Injection technique: single-shot Needle Needle type: Whitacre  Needle gauge: 22 G Needle length: 9 cm Needle insertion depth: 6.5 cm Assessment Sensory level: T8 Additional Notes Negative paresthesia. Negative blood return. Positive free-flowing CSF. Expiration date of kit checked and confirmed. Patient tolerated procedure well, without complications.

## 2015-04-20 NOTE — Progress Notes (Signed)
Orthopedic Tech Progress Note Patient Details:  Kara Willis 1940/12/08 [id number]  CPM Right Knee CPM Right Knee: On Right Knee Flexion (Degrees): 90 Right Knee Extension (Degrees): 0 Additional Comments: Trapeze bar and foot roll   Saul FordyceJennifer C Brianne Maina 04/20/2015, 10:40 AM

## 2015-04-20 NOTE — Op Note (Addendum)
PREOP DIAGNOSIS: DJD RIGHT KNEE POSTOP DIAGNOSIS: same PROCEDURE: RIGHT TKR ANESTHESIA: Spinal and MAC ATTENDING SURGEON: Fabian Coca G ASSISTANT: Elodia Florence PA  INDICATIONS FOR PROCEDURE: BLENDA Willis is a 75 y.o. female who has struggled for a long time with pain due to degenerative arthritis of the right knee.  The patient has failed many conservative non-operative measures and at this point has pain which limits the ability to sleep and walk.  The patient is offered total knee replacement.  Informed operative consent was obtained after discussion of possible risks of anesthesia, infection, neurovascular injury, DVT, and death.  The importance of the post-operative rehabilitation protocol to optimize result was stressed extensively with the patient.  SUMMARY OF FINDINGS AND PROCEDURE:  Kara Willis was taken to the operative suite where under the above anesthesia a right knee replacement was performed.  There were advanced degenerative changes and the bone quality was excellent.  We used the DePuy LCS system and placed size standard plus femur, revision tibia, 38 mm all polyethylene patella, and a size 10 mm spacer.  Elodia Florence PA-C assisted throughout and was invaluable to the completion of the case in that he helped retract and maintain exposure while I placed components.  He also helped close thereby minimizing OR time.  The patient was admitted for appropriate post-op care to include perioperative antibiotics and mechanical and pharmacologic measures for DVT prophylaxis.  DESCRIPTION OF PROCEDURE:  Kara Willis was taken to the operative suite where the above anesthesia was applied.  The patient was positioned supine and prepped and draped in normal sterile fashion.  An appropriate time out was performed.  After the administration of kefzol pre-op antibiotic the leg was elevated and exsanguinated and a tourniquet inflated. A standard longitudinal incision was made on  the anterior knee.  Dissection was carried down to the extensor mechanism.  All appropriate anti-infective measures were used including the pre-operative antibiotic, betadine impregnated drape, and closed hooded exhaust systems for each member of the surgical team.  A medial parapatellar incision was made in the extensor mechanism and the knee cap flipped and the knee flexed.  Some residual meniscal tissues were removed along with any remaining ACL/PCL tissue.  A guide was placed on the tibia and a flat cut was made on it's superior surface.  An intramedullary guide was placed in the femur and was utilized to make anterior and posterior cuts creating an appropriate flexion gap.  A second intramedullary guide was placed in the femur to make a distal cut properly balancing the knee with an extension gap equal to the flexion gap.  The three bones sized to the above mentioned sizes and the appropriate guides were placed and utilized.  A trial reduction was done and the knee easily came to full extension and the patella tracked well on flexion.  The trial components were removed and all bones were cleaned with pulsatile lavage and then dried thoroughly.  Cement was mixed and was pressurized onto the bones followed by placement of the aforementioned components.  Excess cement was trimmed and pressure was held on the components until the cement had hardened.  The tourniquet was deflated and a small amount of bleeding was controlled with cautery and pressure.  The knee was irrigated thoroughly.  The extensor mechanism was re-approximated with V-loc suture in running fashion.  The knee was flexed and the repair was solid.  The subcutaneous tissues were re-approximated with #0 and #2-0 vicryl and the skin closed  with a subcuticular stitch and steristrips.  A sterile dressing was applied.  Intraoperative fluids, EBL, and tourniquet time can be obtained from anesthesia records.  DISPOSITION:  The patient was taken to recovery  room in stable condition and admitted for appropriate post-op care to include peri-operative antibiotic and DVT prophylaxis with mechanical and pharmacologic measures.  Caralina Nop G 04/20/2015, 9:04 AM

## 2015-04-20 NOTE — Evaluation (Signed)
Physical Therapy Evaluation Patient Details Name: Kara Willis MRN: [id number] DOB: 07-26-1940 Today's Date: 04/20/2015   History of Present Illness  75 yo admitted for R TKA with PMHx of L TKA   Clinical Impression  Pt is very motivated, in good spirits and currently with good pain control. Pt able to perform SLR and walk in hall first time up without buckling and moving well. Pt with decreased strength, ROM and activity tolerance who will benefit from acute therapy to maximize mobility and function to decrease burden of care. Pt educated for CPM use, bone foam (in use end of session), transfers and gait with HEP handout provided.     Follow Up Recommendations Home health PT    Equipment Recommendations  None recommended by PT    Recommendations for Other Services       Precautions / Restrictions Precautions Precautions: Knee Restrictions Weight Bearing Restrictions: Yes LLE Weight Bearing: Weight bearing as tolerated      Mobility  Bed Mobility Overal bed mobility: Modified Independent                Transfers Overall transfer level: Needs assistance   Transfers: Sit to/from Stand Sit to Stand: Supervision         General transfer comment: cues for hand placement and safety  Ambulation/Gait Ambulation/Gait assistance: Supervision Ambulation Distance (Feet): 150 Feet Assistive device: Rolling walker (2 wheeled) Gait Pattern/deviations: Step-through pattern;Decreased stride length;Decreased dorsiflexion - left   Gait velocity interpretation: Below normal speed for age/gender General Gait Details: cues for posture and heel strike  Stairs            Wheelchair Mobility    Modified Rankin (Stroke Patients Only)       Balance                                             Pertinent Vitals/Pain Pain Assessment: 0-10 Pain Score: 1  Pain Location: left knee Pain Descriptors / Indicators: Aching Pain Intervention(s):  Limited activity within patient's tolerance;Premedicated before session;Repositioned;Ice applied;Monitored during session    Home Living Family/patient expects to be discharged to:: Private residence Living Arrangements: Spouse/significant other Available Help at Discharge: Available 24 hours/day Type of Home: House Home Access: Stairs to enter   [employer] of Steps: 2 Home Layout: Two level;Bed/bath upstairs Home Equipment: Walker - 2 wheels;Bedside commode      Prior Function Level of Independence: Independent               Hand Dominance        Extremity/Trunk Assessment   Upper Extremity Assessment: Overall WFL for tasks assessed           Lower Extremity Assessment: LLE deficits/detail   LLE Deficits / Details: decreased strength and ROM as expected post op  Cervical / Trunk Assessment: Normal  Communication   Communication: No difficulties  Cognition Arousal/Alertness: Awake/alert Behavior During Therapy: WFL for tasks assessed/performed Overall Cognitive Status: Within Functional Limits for tasks assessed                      General Comments      Exercises Total Joint Exercises Heel Slides: AROM;Left;10 reps;Supine Straight Leg Raises: AROM;Left;10 reps;Supine      Assessment/Plan    PT Assessment Patient needs continued PT services  PT Diagnosis Difficulty walking;Acute pain   PT  Problem List Decreased strength;Decreased range of motion;Decreased activity tolerance;Decreased mobility;Pain;Decreased knowledge of use of DME  PT Treatment Interventions DME instruction;Gait training;Stair training;Functional mobility training;Therapeutic activities;Therapeutic exercise;Patient/family education   PT Goals (Current goals can be found in the Care Plan section) Acute Rehab PT Goals Patient Stated Goal: go hiking PT Goal Formulation: With patient Time For Goal Achievement: 04/27/15 Potential to Achieve Goals: Good     Frequency 7X/week   Barriers to discharge        Co-evaluation               End of Session   Activity Tolerance: Patient tolerated treatment well Patient left: in chair;with call bell/phone within reach Nurse Communication: Mobility status;Weight bearing status         Time: 1202-1224 PT Time Calculation (min) (ACUTE ONLY): 22 min   Charges:   PT Evaluation $PT Eval Moderate Complexity: 1 Procedure     PT G CodesDelorse Lek:        Tabor, Latorsha Curling Beth 04/20/2015, 12:30 PM Delaney MeigsMaija Tabor Solei Wubben, PT [phone]

## 2015-04-20 NOTE — Anesthesia Postprocedure Evaluation (Signed)
Anesthesia Post Note  Patient: Kara Willis  Procedure(s) Performed: Procedure(s) (LRB): TOTAL KNEE ARTHROPLASTY (Right)  Patient location during evaluation: PACU Anesthesia Type: Spinal Level of consciousness: oriented and awake and alert Pain management: pain level controlled Vital Signs Assessment: post-procedure vital signs reviewed and stable Respiratory status: spontaneous breathing, respiratory function stable and patient connected to nasal cannula oxygen Cardiovascular status: blood pressure returned to baseline and stable Postop Assessment: no headache and no backache Anesthetic complications: no    Last Vitals:  Filed Vitals:   04/20/15 1015 04/20/15 1030  BP: 115/64 117/70  Pulse: 66 69  Temp:    Resp: 13 19    Last Pain: There were no vitals filed for this visit.          L Sensory Level: S3-Medial thigh (04/20/15 1030) R Sensory Level: S3-Medial thigh (04/20/15 1030)  Cecilia Nishikawa

## 2015-04-21 ENCOUNTER — Encounter (HOSPITAL_COMMUNITY): Payer: Self-pay | Admitting: Orthopaedic Surgery

## 2015-04-21 NOTE — Progress Notes (Signed)
Physical Therapy Treatment Patient Details Name: Kara Willis MRN: [id number] DOB: 1940/06/03 Today's Date: 04/21/2015    History of Present Illness 75 yo admitted for R TKA with PMHx of L TKA     PT Comments    Patient see for PT session with focus on ambulation. Patient ambulating in/out of bathroom followed by ambulation in the hall for 250 feet and up/down 5 stairs. Following session patient states she is feeling confident with her mobility and planning to D/C home tomorrow. PT to continue to follow and progress mobility.   Follow Up Recommendations  Home health PT     Equipment Recommendations  None recommended by PT    Recommendations for Other Services       Precautions / Restrictions Precautions Precautions: Knee Precaution Booklet Issued: Yes (comment) Precaution Comments: HEP Required Braces or Orthoses: Knee Immobilizer - Right Knee Immobilizer - Right: On when out of bed or walking Restrictions Weight Bearing Restrictions: Yes LLE Weight Bearing: Weight bearing as tolerated    Mobility  Bed Mobility Overal bed mobility: Modified Independent             General bed mobility comments: using bed rail, supine-sit  Transfers Overall transfer level: Needs assistance Equipment used: Rolling walker (2 wheeled) Transfers: Sit to/from Stand Sit to Stand: Supervision         General transfer comment: no cues needed with hand placement  Ambulation/Gait Ambulation/Gait assistance: Min guard;Supervision Ambulation Distance (Feet): 250 Feet Assistive device: Rolling walker (2 wheeled)       General Gait Details: cues to slow cadence for improved stability   Stairs Stairs: Yes Stairs assistance: Min guard Stair Management: Two rails;Step to pattern;Forwards Number of Stairs: 5 General stair comments: Patient reports feeling confident with stairs.   Wheelchair Mobility    Modified Rankin (Stroke Patients Only)       Balance Overall  balance assessment: Needs assistance Sitting-balance support: No upper extremity supported Sitting balance-Leahy Scale: Good     Standing balance support: No upper extremity supported Standing balance-Leahy Scale: Fair Standing balance comment: static with no device                    Cognition Arousal/Alertness: Awake/alert Behavior During Therapy: WFL for tasks assessed/performed Overall Cognitive Status: Within Functional Limits for tasks assessed                      Exercises Total Joint Exercises Ankle Circles/Pumps: AROM;Both;10 reps;Supine Quad Sets: Strengthening;Right;10 reps;Supine    General Comments        Pertinent Vitals/Pain Pain Assessment: Faces Pain Score: 2  Faces Pain Scale: Hurts a little bit Pain Location: Rt knee Pain Descriptors / Indicators: Guarding Pain Intervention(s): Monitored during session;Limited activity within patient's tolerance    Home Living Family/patient expects to be discharged to:: Private residence Living Arrangements: Spouse/significant other Available Help at Discharge: Family;Available 24 hours/day Type of Home: House Home Access: Stairs to enter   Home Layout: Two level;Bed/bath upstairs Home Equipment: Walker - 2 wheels;Cane - single point;Bedside commode;Adaptive equipment      Prior Function Level of Independence: Independent      Comments: Retired   PT Goals (current goals can now be found in the care plan section) Acute Rehab PT Goals Patient Stated Goal: to go home tomorrow PT Goal Formulation: With patient Time For Goal Achievement: 04/27/15 Potential to Achieve Goals: Good Progress towards PT goals: Progressing toward goals    Frequency  7X/week    PT Plan Current plan remains appropriate    Co-evaluation             End of Session Equipment Utilized During Treatment: Gait belt;Right knee immobilizer Activity Tolerance: Patient tolerated treatment well Patient left: in  chair;with call bell/phone within reach;Other (comment) (in bone foam)     Time: 1457-1520 PT Time Calculation (min) (ACUTE ONLY): 23 min  Charges:  $Gait Training: 23-37 mins                    G Codes:      Christiane HaBenjamin J. Donyetta Ogletree, PT, CSCS Pager [phone] Office [phone]  04/21/2015, 3:36 PM

## 2015-04-21 NOTE — Progress Notes (Signed)
Subjective: 1 Day Post-Op Procedure(s) (LRB): TOTAL KNEE ARTHROPLASTY (Right)  Activity level:  wbat Diet tolerance:  ok Voiding:  ok Patient reports pain as mild and moderate.    Objective: Vital signs in last 24 hours: Temp:  [97.5 F (36.4 C)-98.7 F (37.1 C)] 98.2 F (36.8 C) (01/11 0546) Pulse Rate:  [10-78] 78 (01/11 0546) Resp:  [13-20] 19 (01/11 0546) BP: (111-132)/(53-87) 119/53 mmHg (01/11 0546) SpO2:  [96 %-100 %] 97 % (01/11 0546)  Labs: No results for input(s): HGB in the last 72 hours. No results for input(s): WBC, RBC, HCT, PLT in the last 72 hours. No results for input(s): NA, K, CL, CO2, BUN, CREATININE, GLUCOSE, CALCIUM in the last 72 hours. No results for input(s): LABPT, INR in the last 72 hours.  Physical Exam:  Neurologically intact ABD soft Neurovascular intact Sensation intact distally Intact pulses distally Dorsiflexion/Plantar flexion intact Incision: dressing C/D/I and no drainage No cellulitis present Compartment soft  Assessment/Plan:  1 Day Post-Op Procedure(s) (LRB): TOTAL KNEE ARTHROPLASTY (Right) Advance diet Up with therapy D/C IV fluids Plan for discharge tomorrow Discharge home with home health if doing well and cleared by PT. Continue on ASA 325 mg BID x 2 weeks. I will change bandage to aquacel tomorrow. Follow up in office 2 weeks post op.  Piera Downs, Ginger OrganNDREW PAUL 04/21/2015, 7:58 AM

## 2015-04-21 NOTE — Progress Notes (Signed)
Occupational Therapy Evaluation/Discharge Patient Details Name: Kara Willis MRN: [id number] DOB: [birth year] Today's Date: 04/21/2015    History of Present Illness 75 yo admitted for R TKA with PMHx of L TKA    Clinical Impression   PTA, pt was independent with all ADLs and mobility. Pt currently presents with acute R knee pain and required supervision for functional mobility and min assist for LB ADLs. Pt already has AE from previous surgery and will be able to complete LB ADLs with assist from husband. Reviewed knee precautions, knee immobilizer protocol, compensatory strategies for bed mobility, and fall prevention strategies. All education has been completed and pt has no further questions. OT signing off. Thank you for this referral.    Follow Up Recommendations  No OT follow up;Supervision - Intermittent    Equipment Recommendations  None recommended by OT    Recommendations for Other Services       Precautions / Restrictions Precautions Precautions: Knee Precaution Booklet Issued: No Required Braces or Orthoses: Knee Immobilizer - Right Knee Immobilizer - Right: On when out of bed or walking Restrictions Weight Bearing Restrictions: Yes LLE Weight Bearing: Weight bearing as tolerated      Mobility Bed Mobility Overal bed mobility: Modified Independent             General bed mobility comments: Supine-sit and sit-supine HOB flat, use of bedrails. Pt reported that she has a long green strap that she used to assist LE onto bed at home.  Transfers Overall transfer level: Needs assistance Equipment used: Rolling walker (2 wheeled) Transfers: Sit to/from Stand Sit to Stand: Supervision         General transfer comment: Supervision for safety. Good demonstration of safe hand placement on seated surfaces and safe RW use.     Balance Overall balance assessment: Needs assistance Sitting-balance support: No upper extremity supported;Feet supported Sitting  balance-Leahy Scale: Good     Standing balance support: No upper extremity supported;During functional activity Standing balance-Leahy Scale: Fair Standing balance comment: Able to complete static standing grooming tasks at sink without UE support for ~3 minutes                             ADL Overall ADL's : Needs assistance/impaired     Grooming: Wash/dry hands;Supervision/safety;Standing               Lower Body Dressing: Minimal assistance;Cueing for compensatory techniques;Sit to/from stand Lower Body Dressing Details (indicate cue type and reason): to don/doff socks Toilet Transfer: Supervision/safety;Ambulation;BSC;RW;Cueing for safety   Toileting- Clothing Manipulation and Hygiene: Supervision/safety;Sit to/from stand   Tub/ Shower Transfer: Walk-in shower;Supervision/safety;Cueing for sequencing;Ambulation;Rolling walker Tub/Shower Transfer Details (indicate cue type and reason): Cues for proper step sequence Functional mobility during ADLs: Supervision/safety;Rolling walker General ADL Comments: Pt has AE at home from previous knee surgery and verbalized understanding of how to use it for LB ADLs. Reviewed safe step sequence for shower transfer, compensatory strategies for bed mobility, and fall prevention strategies.      Vision Vision Assessment?: No apparent visual deficits   Perception     Praxis      Pertinent Vitals/Pain Pain Assessment: 0-10 Pain Score: 2  Pain Location: R knee Pain Descriptors / Indicators: Aching;Sore Pain Intervention(s): Limited activity within patient's tolerance;Monitored during session;Repositioned;Ice applied     Hand Dominance Right   Extremity/Trunk Assessment Upper Extremity Assessment Upper Extremity Assessment: Overall WFL for tasks assessed   Lower Extremity  Assessment Lower Extremity Assessment: LLE deficits/detail LLE Deficits / Details: decreased strength and ROM as expected post op   Cervical /  Trunk Assessment Cervical / Trunk Assessment: Normal   Communication Communication Communication: No difficulties   Cognition Arousal/Alertness: Awake/alert Behavior During Therapy: WFL for tasks assessed/performed Overall Cognitive Status: Within Functional Limits for tasks assessed                     General Comments       Exercises       Shoulder Instructions      Home Living Family/patient expects to be discharged to:: Private residence Living Arrangements: Spouse/significant other Available Help at Discharge: Family;Available 24 hours/day Type of Home: House Home Access: Stairs to enter [employer] of Steps: 2   Home Layout: Two level;Bed/bath upstairs Alternate Level Stairs-Number of Steps: 17 Alternate Level Stairs-Rails: Right Bathroom Shower/Tub: Walk-in shower;Door   [employer] Toilet: Standard     Home Equipment: Environmental consultant - 2 wheels;Cane - single point;Bedside commode;Adaptive equipment Adaptive Equipment: Reacher;Sock aid;Long-handled shoe horn;Long-handled sponge        Prior Functioning/Environment Level of Independence: Independent        Comments: Retired    OT Diagnosis: Acute pain   OT Problem List: Decreased strength;Decreased range of motion;Decreased activity tolerance;Impaired balance (sitting and/or standing);Decreased coordination;Pain   OT Treatment/Interventions:      OT Goals(Current goals can be found in the care plan section) Acute Rehab OT Goals Patient Stated Goal: to go home tomorrow OT Goal Formulation: With patient Time For Goal Achievement: 05/05/15 Potential to Achieve Goals: Good  OT Frequency:     Barriers to D/C:            Co-evaluation              End of Session Equipment Utilized During Treatment: Gait belt;Rolling walker;Right knee immobilizer CPM Right Knee CPM Right Knee: On Right Knee Flexion (Degrees): 60 Right Knee Extension (Degrees): 0 Additional Comments: Ice  applied Nurse Communication: Mobility status  Activity Tolerance: Patient tolerated treatment well Patient left: in bed;with call bell/phone within reach   Time: 1330-1355 OT Time Calculation (min): 25 min Charges:  OT General Charges $OT Visit: 1 Procedure OT Evaluation $OT Eval Moderate Complexity: 1 Procedure OT Treatments $Therapeutic Activity: 8-22 mins G-Codes:    Nils Pyle, OTR/L Pager: [phone] 04/21/2015, 2:10 PM

## 2015-04-21 NOTE — Progress Notes (Signed)
Physical Therapy Treatment Patient Details Name: Kara Willis MRN: [id number] DOB: Aug 01, 1940 Today's Date: 04/21/2015    History of Present Illness 75 yo admitted for R TKA with PMHx of L TKA     PT Comments    Patient progressing with PT sessions. Able to ambulate 200 feet with rw and min guard to supervision. Progressing exercises per HEP as well. Anticipating patient will return to home upon D/C, patient in agreement.   Follow Up Recommendations  Home health PT     Equipment Recommendations  None recommended by PT    Recommendations for Other Services       Precautions / Restrictions Precautions Precautions: Knee Required Braces or Orthoses: Knee Immobilizer - Right Restrictions Weight Bearing Restrictions: Yes LLE Weight Bearing: Weight bearing as tolerated    Mobility  Bed Mobility Overal bed mobility: Modified Independent             General bed mobility comments: supine to sit, using rails to assist  Transfers Overall transfer level: Needs assistance Equipment used: Rolling walker (2 wheeled) Transfers: Sit to/from Stand Sit to Stand: Min guard         General transfer comment: cues for hand placement and safety  Ambulation/Gait Ambulation/Gait assistance: Min guard;Supervision Ambulation Distance (Feet): 200 Feet Assistive device: Rolling walker (2 wheeled) Gait Pattern/deviations: Step-through pattern     General Gait Details: long stride with Rt LE   Stairs            Wheelchair Mobility    Modified Rankin (Stroke Patients Only)       Balance Overall balance assessment: Needs assistance Sitting-balance support: No upper extremity supported Sitting balance-Leahy Scale: Good     Standing balance support: Bilateral upper extremity supported Standing balance-Leahy Scale: Poor Standing balance comment: using rw                    Cognition Arousal/Alertness: Awake/alert Behavior During Therapy: WFL for tasks  assessed/performed Overall Cognitive Status: Within Functional Limits for tasks assessed                      Exercises Total Joint Exercises Ankle Circles/Pumps: AROM;Both;10 reps;Supine Quad Sets: Strengthening;Right;10 reps;Supine Heel Slides: AAROM;Right;10 reps;Supine Straight Leg Raises: Strengthening;Right;10 reps;Other (comment) (with lag) Goniometric ROM: 88 degrees flexion    General Comments        Pertinent Vitals/Pain Pain Assessment: 0-10 Pain Score: 6  Pain Location: Rt knee Pain Descriptors / Indicators: Sore;Aching Pain Intervention(s): Limited activity within patient's tolerance;Monitored during session    Home Living                      Prior Function            PT Goals (current goals can now be found in the care plan section) Acute Rehab PT Goals Patient Stated Goal: get better and go home PT Goal Formulation: With patient Time For Goal Achievement: 04/27/15 Potential to Achieve Goals: Good Progress towards PT goals: Progressing toward goals    Frequency  7X/week    PT Plan Current plan remains appropriate    Co-evaluation             End of Session Equipment Utilized During Treatment: Gait belt;Right knee immobilizer Activity Tolerance: Patient tolerated treatment well Patient left: in chair;with call bell/phone within reach;Other (comment) (in bone foam)     Time: 4098-1191 PT Time Calculation (min) (ACUTE ONLY): 28 min  Charges:  $  Gait Training: 8-22 mins $Therapeutic Exercise: 8-22 mins                    G Codes:      Christiane HaBenjamin J. Samarra Ridgely, PT, CSCS Pager [phone] Office [phone]  04/21/2015, 10:29 AM

## 2015-04-21 NOTE — Care Management Note (Signed)
Case Management Note  Patient Details  Name: Kara Willis MRN: [id number] Date of Birth: 02/28/1941  Subjective/Objective:            S/p right total knee arthroplasty        Action/Plan: [facility] by MD office. Spoke with patient, no change in discharge plan. Patient stated that she has a rolling walker and a 3N1 at home. Spoke with Kipp BroodBrent from [employer], they will deliver CPM to home. Patient stated that her husband will be able to assist her after discharge.      Expected Discharge Date:                  Expected Discharge Plan:  Home w Home Health Services  In-House Referral:  NA  Discharge planning Services  CM Consult  Post Acute Care Choice:  Durable Medical Equipment, Home Health Choice offered to:  Patient  DME Arranged:  CPM DME Agency:  [employer]  HH Arranged:  PT HH Agency:  [facility]  Status of Service:  Completed, signed off  Medicare Important Message Given:    Date Medicare IM Given:    Medicare IM give by:    Date Additional Medicare IM Given:    Additional Medicare Important Message give by:     If discussed at Long Length of Stay Meetings, dates discussed:    Additional Comments:  Monica BectonKrieg, [name], RN 04/21/2015, 4:00 PM

## 2015-04-21 NOTE — Progress Notes (Signed)
Orthopedic Tech Progress Note Patient Details:  Kara Willis 02-21-1941 [id number] On cpm at 1830 0-70 Patient ID: Kara Willis, female   DOB: 02-21-1941, 75 y.o.   MRN: [id number]   [name], Raymund Manrique Craig 04/21/2015, 6:36 PM

## 2015-04-22 MED ORDER — ASPIRIN 325 MG PO TBEC: 325.0000 mg | DELAYED_RELEASE_TABLET | Freq: Two times a day (BID) | ORAL | Status: DC

## 2015-04-22 MED ORDER — HYDROCODONE-ACETAMINOPHEN 5-325 MG PO TABS: 1.0000 | ORAL_TABLET | ORAL | Status: DC | PRN

## 2015-04-22 MED ORDER — METHOCARBAMOL 500 MG PO TABS: 500.0000 mg | ORAL_TABLET | Freq: Four times a day (QID) | ORAL | Status: DC | PRN

## 2015-04-22 NOTE — Progress Notes (Signed)
Physical Therapy Treatment Patient Details Name: Lorraine Laxlizabeth D Rohner MRN: [id number] DOB: March 23, 1941 Today's Date: 04/22/2015    History of Present Illness 75 yo admitted for R TKA with PMHx of L TKA     PT Comments    Pt progressing well with gait, stairs and HEP. Pt continues to rely less on RW and requires cues to normalize gait and increase swing phase. Pt educated for HEP and bone foam (in use end of session). Will continue to follow. Safe for D/C home.   Follow Up Recommendations  Home health PT     Equipment Recommendations       Recommendations for Other Services       Precautions / Restrictions Precautions Precautions: Knee Restrictions Weight Bearing Restrictions: Yes LLE Weight Bearing: Weight bearing as tolerated    Mobility  Bed Mobility               General bed mobility comments: in chair on arrival  Transfers Overall transfer level: Modified independent                  Ambulation/Gait Ambulation/Gait assistance: Supervision Ambulation Distance (Feet): 500 Feet Assistive device: Rolling walker (2 wheeled) Gait Pattern/deviations: Step-through pattern;Decreased stride length;Decreased dorsiflexion - right   Gait velocity interpretation: Below normal speed for age/gender General Gait Details: cues for increased knee flexion with swing and heel strike    Stairs Stairs: Yes Stairs assistance: Supervision Stair Management: One rail Right;Sideways Number of Stairs: 11 General stair comments: cues for sequence with pt able to complete without difficulty  Wheelchair Mobility    Modified Rankin (Stroke Patients Only)       Balance                                    Cognition Arousal/Alertness: Awake/alert Behavior During Therapy: WFL for tasks assessed/performed Overall Cognitive Status: Within Functional Limits for tasks assessed                      Exercises Total Joint Exercises Hip  ABduction/ADduction: AROM;Seated;Right;15 reps Straight Leg Raises: AROM;Seated;Right;15 reps Long Arc Quad: AROM;Seated;Right;15 reps Knee Flexion: AROM;Seated;Right;15 reps Goniometric ROM: 8-90 Marching in Standing: AROM;Seated;Right;15 reps    General Comments        Pertinent Vitals/Pain Pain Score: 1  Pain Location: Rt knee Pain Descriptors / Indicators: Aching Pain Intervention(s): Limited activity within patient's tolerance;Monitored during session;Premedicated before session;Repositioned    Home Living                      Prior Function            PT Goals (current goals can now be found in the care plan section) Progress towards PT goals: Progressing toward goals    Frequency       PT Plan Current plan remains appropriate    Co-evaluation             End of Session   Activity Tolerance: Patient tolerated treatment well Patient left: in chair;with call bell/phone within reach     Time: 0912-0929 PT Time Calculation (min) (ACUTE ONLY): 17 min  Charges:  $Gait Training: 8-22 mins                    G Codes:      Delorse Lekabor, Kedron Uno Beth 04/22/2015, 10:02 AM Delaney MeigsMaija Tabor Tali Coster, PT [phone]

## 2015-04-22 NOTE — Discharge Summary (Signed)
Patient ID: Kara Willis MRN: [id number] DOB/AGE: 1940-07-24 75 y.o.  Admit date: 04/20/2015 Discharge date: 04/22/2015  Admission Diagnoses:  Principal Problem:   Primary osteoarthritis of right knee   Discharge Diagnoses:  Same  Past Medical History  Diagnosis Date  . Arthritis   . DJD (degenerative joint disease)     Surgeries: Procedure(s): TOTAL KNEE ARTHROPLASTY on 04/20/2015   Consultants:    Discharged Condition: Improved  Hospital Course: Kara Willis is an 75 y.o. female who was admitted 04/20/2015 for operative treatment ofPrimary osteoarthritis of right knee. Patient has severe unremitting pain that affects sleep, daily activities, and work/hobbies. After pre-op clearance the patient was taken to the operating room on 04/20/2015 and underwent  Procedure(s): TOTAL KNEE ARTHROPLASTY.    Patient was given perioperative antibiotics: Anti-infectives    Start     Dose/Rate Route Frequency Ordered Stop   04/20/15 1300  ceFAZolin (ANCEF) IVPB 2 g/50 mL premix     2 g 100 mL/hr over 30 Minutes Intravenous Every 6 hours 04/20/15 1115 04/20/15 2204   04/20/15 0700  ceFAZolin (ANCEF) IVPB 2 g/50 mL premix     2 g 100 mL/hr over 30 Minutes Intravenous To ShortStay Surgical 04/19/15 1305 04/20/15 4098       Patient was given sequential compression devices, early ambulation, and chemoprophylaxis to prevent DVT.  Patient benefited maximally from hospital stay and there were no complications.    Recent vital signs: Patient Vitals for the past 24 hrs:  BP Temp Temp src Pulse Resp SpO2  04/22/15 0410 (!) 125/55 mmHg 98 F (36.7 C) Oral 82 17 94 %  04/21/15 2127 128/61 mmHg 99.1 F (37.3 C) Oral 86 18 96 %     Recent laboratory studies: No results for input(s): WBC, HGB, HCT, PLT, NA, K, CL, CO2, BUN, CREATININE, GLUCOSE, INR, CALCIUM in the last 72 hours.  Invalid input(s): PT, 2   Discharge Medications:     Medication List    STOP taking these  medications        celecoxib 200 MG capsule  Commonly known as:  CELEBREX      TAKE these medications        aspirin 325 MG EC tablet  Take 1 tablet (325 mg total) by mouth 2 (two) times daily after a meal.     cholecalciferol 1000 units tablet  Commonly known as:  VITAMIN D  Take 1,000 Units by mouth daily.     fluticasone 50 MCG/ACT nasal spray  Commonly known as:  FLONASE  Place 1 spray into both nostrils daily as needed.     HYDROcodone-acetaminophen 5-325 MG tablet  Commonly known as:  NORCO/VICODIN  Take 1-2 tablets by mouth every 4 (four) hours as needed (breakthrough pain).     latanoprost 0.005 % ophthalmic solution  Commonly known as:  XALATAN  Place 1 drop into both eyes at bedtime.     methocarbamol 500 MG tablet  Commonly known as:  ROBAXIN  Take 1 tablet (500 mg total) by mouth every 6 (six) hours as needed for muscle spasms.     pravastatin 40 MG tablet  Commonly known as:  PRAVACHOL  Take 40 mg by mouth daily.        Diagnostic Studies: Dg Chest 2 View  04/09/2015  CLINICAL DATA:  Preoperative imaging prior to knee replacement. EXAM: CHEST  2 VIEW COMPARISON:  Chest radiograph 04/08/2014. FINDINGS: Stable cardiac and mediastinal contours including calcified right hilar lymph nodes. No consolidative  pulmonary opacities. There is a 7 mm calcified nodular density within the right mid lung, compatible with a granuloma. No pleural effusion or pneumothorax. Thoracic spine degenerative changes. IMPRESSION: No acute cardiopulmonary process. Electronically Signed   By: Annia Belt M.D.   On: 04/09/2015 09:10    Disposition: 01-Home or Self Care      Discharge Instructions    Call MD / Call 911    Complete by:  As directed   If you experience chest pain or shortness of breath, CALL 911 and be transported to the hospital emergency room.  If you develope a fever above 101 F, pus (white drainage) or increased drainage or redness at the wound, or calf pain, call  your surgeon's office.     Constipation Prevention    Complete by:  As directed   Drink plenty of fluids.  Prune juice may be helpful.  You may use a stool softener, such as Colace (over the counter) 100 mg twice a day.  Use MiraLax (over the counter) for constipation as needed.     Diet - low sodium heart healthy    Complete by:  As directed      Discharge instructions    Complete by:  As directed   INSTRUCTIONS AFTER JOINT REPLACEMENT   Remove items at home which could result in a fall. This includes throw rugs or furniture in walking pathways ICE to the affected joint every three hours while awake for 30 minutes at a time, for at least the first 3-5 days, and then as needed for pain and swelling.  Continue to use ice for pain and swelling. You may notice swelling that will progress down to the foot and ankle.  This is normal after surgery.  Elevate your leg when you are not up walking on it.   Continue to use the breathing machine you got in the hospital (incentive spirometer) which will help keep your temperature down.  It is common for your temperature to cycle up and down following surgery, especially at night when you are not up moving around and exerting yourself.  The breathing machine keeps your lungs expanded and your temperature down.   DIET:  As you were doing prior to hospitalization, we recommend a well-balanced diet.  DRESSING / WOUND CARE / SHOWERING  You may shower 3 days after surgery, but keep the wounds dry during showering.  You may use an occlusive plastic wrap (Press'n Seal for example), NO SOAKING/SUBMERGING IN THE BATHTUB.  If the bandage gets wet, change with a clean dry gauze.  If the incision gets wet, pat the wound dry with a clean towel.  ACTIVITY  Increase activity slowly as tolerated, but follow the weight bearing instructions below.   No driving for 6 weeks or until further direction given by your physician.  You cannot drive while taking narcotics.  No  lifting or carrying greater than 10 lbs. until further directed by your surgeon. Avoid periods of inactivity such as sitting longer than an hour when not asleep. This helps prevent blood clots.  You may return to work once you are authorized by your doctor.     WEIGHT BEARING   Weight bearing as tolerated with assist device (walker, cane, etc) as directed, use it as long as suggested by your surgeon or therapist, typically at least 4-6 weeks.   EXERCISES  Results after joint replacement surgery are often greatly improved when you follow the exercise, range of motion and muscle strengthening exercises  prescribed by your doctor. Safety measures are also important to protect the joint from further injury. Any time any of these exercises cause you to have increased pain or swelling, decrease what you are doing until you are comfortable again and then slowly increase them. If you have problems or questions, call your caregiver or physical therapist for advice.   Rehabilitation is important following a joint replacement. After just a few days of immobilization, the muscles of the leg can become weakened and shrink (atrophy).  These exercises are designed to build up the tone and strength of the thigh and leg muscles and to improve motion. Often times heat used for twenty to thirty minutes before working out will loosen up your tissues and help with improving the range of motion but do not use heat for the first two weeks following surgery (sometimes heat can increase post-operative swelling).   These exercises can be done on a training (exercise) mat, on the floor, on a table or on a bed. Use whatever works the best and is most comfortable for you.    Use music or television while you are exercising so that the exercises are a pleasant break in your day. This will make your life better with the exercises acting as a break in your routine that you can look forward to.   Perform all exercises about fifteen  times, three times per day or as directed.  You should exercise both the operative leg and the other leg as well.   Exercises include:   Quad Sets - Tighten up the muscle on the front of the thigh (Quad) and hold for 5-10 seconds.   Straight Leg Raises - With your knee straight (if you were given a brace, keep it on), lift the leg to 60 degrees, hold for 3 seconds, and slowly lower the leg.  Perform this exercise against resistance later as your leg gets stronger.  Leg Slides: Lying on your back, slowly slide your foot toward your buttocks, bending your knee up off the floor (only go as far as is comfortable). Then slowly slide your foot back down until your leg is flat on the floor again.  Angel Wings: Lying on your back spread your legs to the side as far apart as you can without causing discomfort.  Hamstring Strength:  Lying on your back, push your heel against the floor with your leg straight by tightening up the muscles of your buttocks.  Repeat, but this time bend your knee to a comfortable angle, and push your heel against the floor.  You may put a pillow under the heel to make it more comfortable if necessary.   A rehabilitation program following joint replacement surgery can speed recovery and prevent re-injury in the future due to weakened muscles. Contact your doctor or a physical therapist for more information on knee rehabilitation.    CONSTIPATION  Constipation is defined medically as fewer than three stools per week and severe constipation as less than one stool per week.  Even if you have a regular bowel pattern at home, your normal regimen is likely to be disrupted due to multiple reasons following surgery.  Combination of anesthesia, postoperative narcotics, change in appetite and fluid intake all can affect your bowels.   YOU MUST use at least one of the following options; they are listed in order of increasing strength to get the job done.  They are all available over the  counter, and you may need to use some,  POSSIBLY even all of these options:    Drink plenty of fluids (prune juice may be helpful) and high fiber foods Colace 100 mg by mouth twice a day  Senokot for constipation as directed and as needed Dulcolax (bisacodyl), take with full glass of water  Miralax (polyethylene glycol) once or twice a day as needed.  If you have tried all these things and are unable to have a bowel movement in the first 3-4 days after surgery call either your surgeon or your primary doctor.    If you experience loose stools or diarrhea, hold the medications until you stool forms back up.  If your symptoms do not get better within 1 week or if they get worse, check with your doctor.  If you experience "the worst abdominal pain ever" or develop nausea or vomiting, please contact the office immediately for further recommendations for treatment.   ITCHING:  If you experience itching with your medications, try taking only a single pain pill, or even half a pain pill at a time.  You can also use Benadryl over the counter for itching or also to help with sleep.   TED HOSE STOCKINGS:  Use stockings on both legs until for at least 2 weeks or as directed by physician office. They may be removed at night for sleeping.  MEDICATIONS:  See your medication summary on the "After Visit Summary" that nursing will review with you.  You may have some home medications which will be placed on hold until you complete the course of blood thinner medication.  It is important for you to complete the blood thinner medication as prescribed.  PRECAUTIONS:  If you experience chest pain or shortness of breath - call 911 immediately for transfer to the hospital emergency department.   If you develop a fever greater that 101 F, purulent drainage from wound, increased redness or drainage from wound, foul odor from the wound/dressing, or calf pain - CONTACT YOUR SURGEON.                                                    FOLLOW-UP APPOINTMENTS:  If you do not already have a post-op appointment, please call the office for an appointment to be seen by your surgeon.  Guidelines for how soon to be seen are listed in your "After Visit Summary", but are typically between 1-4 weeks after surgery.  OTHER INSTRUCTIONS:   Knee Replacement:  Do not place pillow under knee, focus on keeping the knee straight while resting. CPM instructions: 0-90 degrees, 2 hours in the morning, 2 hours in the afternoon, and 2 hours in the evening. Place foam block, curve side up under heel at all times except when in CPM or when walking.  DO NOT modify, tear, cut, or change the foam block in any way.  MAKE SURE YOU:  Understand these instructions.  Get help right away if you are not doing well or get worse.    Thank you for letting us be a part of your medical care team.  It is a privilege we respect greatly.  We hope these instructions will help you stay on track for a fast and full recovery!     Increase activity slowly as tolerated    Complete by:  As directed  Follow-up Information    Follow up with Velna OchsALLDORF,PETER G, MD. Schedule an appointment as soon as possible for a visit in 2 weeks.   Specialty:  Orthopedic Surgery   Contact information:   [address] [place] [address] [phone]       Follow up with [facility].   Why:  They will contact you to schedule home therapy visits.   Contact information:   [address] [place] [address] [phone]        Signed: Drema HalonIDA, Adea Geisel PAUL 04/22/2015, 2:18 PM

## 2015-04-22 NOTE — Progress Notes (Signed)
Kara LaxElizabeth D Ikard discharged home per MD order. Discharge instructions reviewed and discussed with patient. All questions and concerns answered. Copy of instructions and scripts given to patient. IV removed.  Patient escorted to car by staff in a wheelchair. No distress noted upon discharge.   Rosita FireScott, Treg Diemer R 04/22/2015 3:23 PM

## 2015-08-27 ENCOUNTER — Other Ambulatory Visit: Payer: Self-pay

## 2015-08-27 DIAGNOSIS — Z1231 Encounter for screening mammogram for malignant neoplasm of breast: Secondary | ICD-10-CM

## 2015-09-01 ENCOUNTER — Ambulatory Visit
Admission: RE | Admit: 2015-09-01 | Discharge: 2015-09-01 | Disposition: A | Discharge status: Home / Self Care | Payer: Medicare Other | Source: Ambulatory Visit

## 2015-09-01 DIAGNOSIS — Z1231 Encounter for screening mammogram for malignant neoplasm of breast: Secondary | ICD-10-CM

## 2016-07-11 IMAGING — CR DG CHEST 2V
2 series · 2 of 2 positions shown · non-contrast
Comparison: No prior.

CLINICAL DATA: Arthroplasty.

EXAM:
CHEST  2 VIEW

[w chest pa]
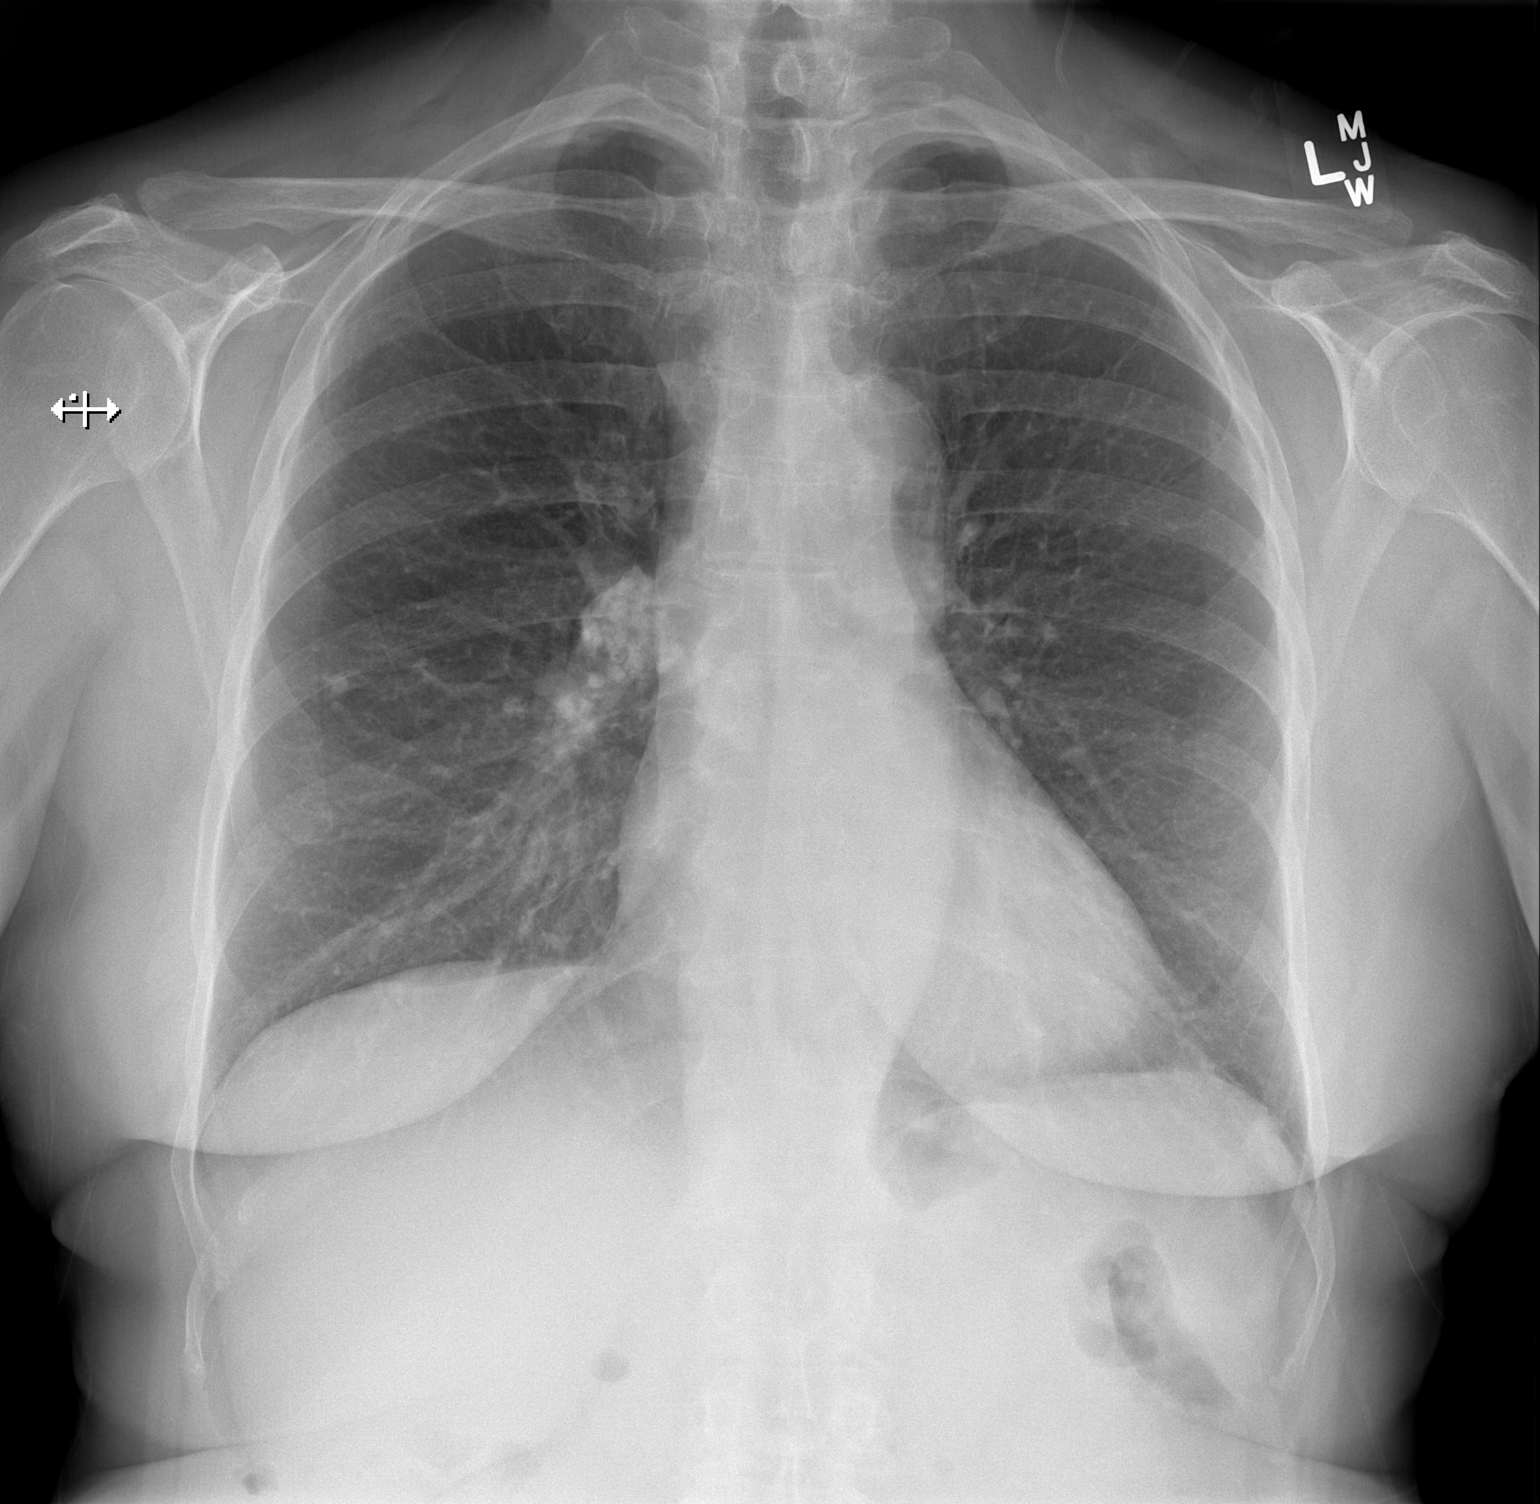

[w chest lat]
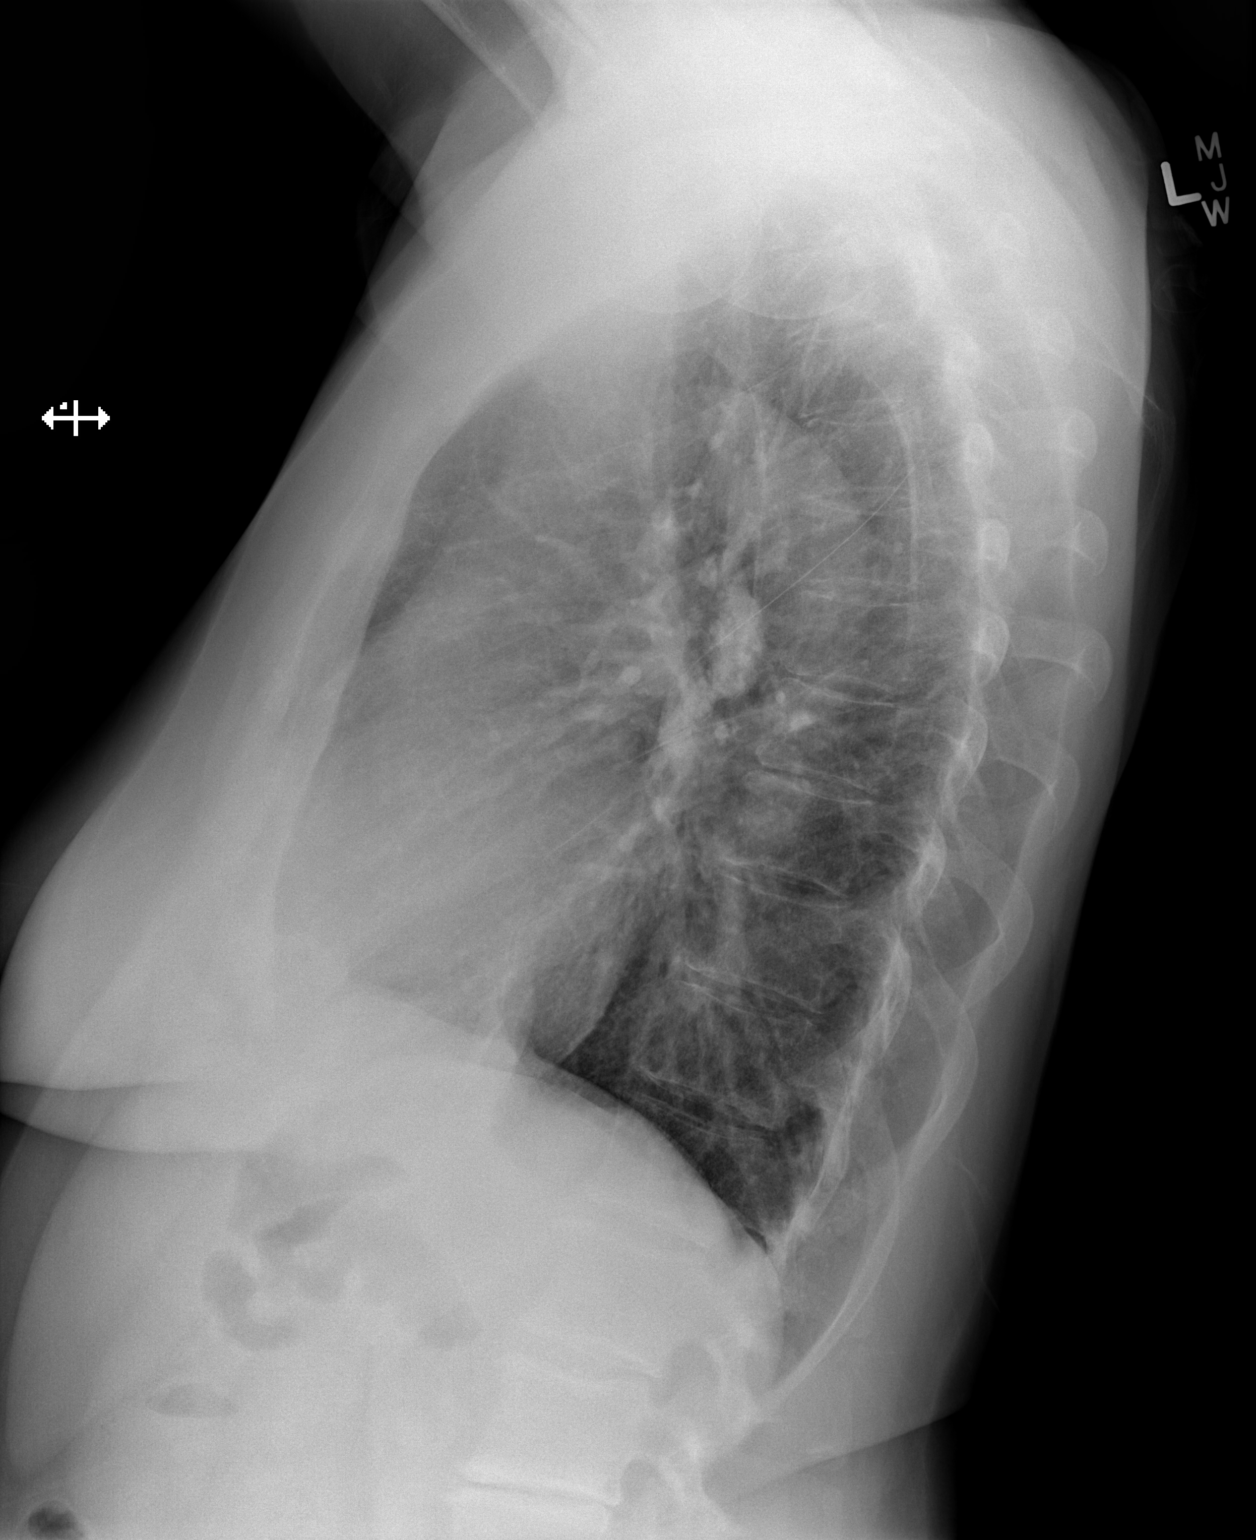

[2 of 2 positions shown; findings below may reference images not displayed]

FINDINGS: Mediastinal structures normal. Lungs are clear. Calcified nodule
right lung consistent with granuloma. No pleural effusion or
pneumothorax. No acute bony abnormality .
IMPRESSION: No active cardiopulmonary disease.

## 2016-07-25 ENCOUNTER — Other Ambulatory Visit: Payer: Self-pay | Admitting: Obstetrics and Gynecology

## 2016-07-25 DIAGNOSIS — Z1231 Encounter for screening mammogram for malignant neoplasm of breast: Secondary | ICD-10-CM

## 2016-09-19 ENCOUNTER — Ambulatory Visit
Admission: RE | Admit: 2016-09-19 | Discharge: 2016-09-19 | Disposition: A | Discharge status: Home / Self Care | Payer: Medicare Other | Source: Ambulatory Visit | Attending: Obstetrics and Gynecology | Admitting: Obstetrics and Gynecology

## 2016-09-19 DIAGNOSIS — Z1231 Encounter for screening mammogram for malignant neoplasm of breast: Secondary | ICD-10-CM

## 2016-12-20 ENCOUNTER — Other Ambulatory Visit: Payer: Self-pay | Admitting: Physician Assistant

## 2016-12-20 DIAGNOSIS — E2839 Other primary ovarian failure: Secondary | ICD-10-CM

## 2016-12-28 ENCOUNTER — Ambulatory Visit
Admission: RE | Admit: 2016-12-28 | Discharge: 2016-12-28 | Disposition: A | Discharge status: Home / Self Care | Payer: Medicare Other | Source: Ambulatory Visit | Attending: Physician Assistant | Admitting: Physician Assistant

## 2016-12-28 DIAGNOSIS — E2839 Other primary ovarian failure: Secondary | ICD-10-CM

## 2018-01-14 ENCOUNTER — Other Ambulatory Visit: Payer: Self-pay | Admitting: Obstetrics and Gynecology

## 2018-01-14 DIAGNOSIS — Z1231 Encounter for screening mammogram for malignant neoplasm of breast: Secondary | ICD-10-CM

## 2018-02-18 ENCOUNTER — Ambulatory Visit
Admission: RE | Admit: 2018-02-18 | Discharge: 2018-02-18 | Disposition: A | Discharge status: Home / Self Care | Payer: Medicare Other | Source: Ambulatory Visit | Attending: Obstetrics and Gynecology | Admitting: Obstetrics and Gynecology

## 2018-02-18 DIAGNOSIS — Z1231 Encounter for screening mammogram for malignant neoplasm of breast: Secondary | ICD-10-CM

## 2019-03-10 ENCOUNTER — Other Ambulatory Visit: Payer: Self-pay | Admitting: Obstetrics and Gynecology

## 2019-03-10 DIAGNOSIS — Z1231 Encounter for screening mammogram for malignant neoplasm of breast: Secondary | ICD-10-CM

## 2019-03-11 ENCOUNTER — Ambulatory Visit
Admission: RE | Admit: 2019-03-11 | Discharge: 2019-03-11 | Disposition: A | Discharge status: Home / Self Care | Payer: Medicare Other | Source: Ambulatory Visit | Attending: Obstetrics and Gynecology | Admitting: Obstetrics and Gynecology

## 2019-03-11 ENCOUNTER — Other Ambulatory Visit: Payer: Self-pay

## 2019-03-11 DIAGNOSIS — Z1231 Encounter for screening mammogram for malignant neoplasm of breast: Secondary | ICD-10-CM

## 2019-04-22 ENCOUNTER — Other Ambulatory Visit: Payer: Self-pay | Admitting: Physician Assistant

## 2019-04-22 DIAGNOSIS — R221 Localized swelling, mass and lump, neck: Secondary | ICD-10-CM

## 2019-04-23 ENCOUNTER — Ambulatory Visit
Admission: RE | Admit: 2019-04-23 | Discharge: 2019-04-23 | Disposition: A | Discharge status: Home / Self Care | Payer: Medicare Other | Source: Ambulatory Visit | Attending: Physician Assistant | Admitting: Physician Assistant

## 2019-04-23 ENCOUNTER — Other Ambulatory Visit: Payer: Self-pay | Admitting: Physician Assistant

## 2019-04-23 DIAGNOSIS — R221 Localized swelling, mass and lump, neck: Secondary | ICD-10-CM

## 2019-05-02 ENCOUNTER — Ambulatory Visit: Payer: Medicare PPO | Attending: Internal Medicine

## 2019-05-02 DIAGNOSIS — Z23 Encounter for immunization: Secondary | ICD-10-CM

## 2019-05-02 NOTE — Progress Notes (Signed)
   Covid-19 Vaccination Clinic  Name:  Kara Willis    MRN: [id number] DOB: Jul 19, 1940  05/02/2019  [name] was observed post Covid-19 immunization for 15 minutes without incidence. She was provided with Vaccine Information Sheet and instruction to access the V-Safe system.   [name] was instructed to call 911 with any severe reactions post vaccine: Marland Kitchen Difficulty breathing  . Swelling of your face and throat  . A fast heartbeat  . A bad rash all over your body  . Dizziness and weakness    Immunizations Administered    Name Date Dose VIS Date Route   Pfizer COVID-19 Vaccine 05/02/2019 12:55 PM 0.3 mL 03/21/2019 Intramuscular   Manufacturer: [employer], [employer]   Lot: [id number]   NDC: [address]-5

## 2019-05-23 ENCOUNTER — Ambulatory Visit: Payer: Medicare PPO | Attending: Internal Medicine

## 2019-05-23 DIAGNOSIS — Z23 Encounter for immunization: Secondary | ICD-10-CM | POA: Insufficient documentation

## 2019-05-23 NOTE — Progress Notes (Signed)
   Covid-19 Vaccination Clinic  Name:  Kara Willis    MRN: [id number] DOB: 1940-07-17  05/23/2019  Kara Willis was observed post Covid-19 immunization for 15 minutes without incidence. She was provided with Vaccine Information Sheet and instruction to access the V-Safe system.   Kara Willis was instructed to call 911 with any severe reactions post vaccine: Marland Kitchen Difficulty breathing  . Swelling of your face and throat  . A fast heartbeat  . A bad rash all over your body  . Dizziness and weakness    Immunizations Administered    Name Date Dose VIS Date Route   Pfizer COVID-19 Vaccine 05/23/2019 12:39 PM 0.3 mL 03/21/2019 Intramuscular   Manufacturer: [employer], [employer]   Lot: [id number]   NDC: [address]-4

## 2019-11-12 ENCOUNTER — Other Ambulatory Visit: Payer: Self-pay | Admitting: Physician Assistant

## 2019-11-12 DIAGNOSIS — E2839 Other primary ovarian failure: Secondary | ICD-10-CM

## 2020-01-16 ENCOUNTER — Ambulatory Visit
Admission: RE | Admit: 2020-01-16 | Discharge: 2020-01-16 | Disposition: A | Discharge status: Home / Self Care | Payer: Medicare PPO | Source: Ambulatory Visit | Attending: Physician Assistant | Admitting: Physician Assistant

## 2020-01-16 ENCOUNTER — Other Ambulatory Visit: Payer: Self-pay

## 2020-01-16 DIAGNOSIS — E2839 Other primary ovarian failure: Secondary | ICD-10-CM

## 2020-02-18 ENCOUNTER — Other Ambulatory Visit: Payer: Self-pay | Admitting: Obstetrics and Gynecology

## 2020-02-18 DIAGNOSIS — Z1231 Encounter for screening mammogram for malignant neoplasm of breast: Secondary | ICD-10-CM

## 2020-04-20 ENCOUNTER — Ambulatory Visit: Payer: Medicare PPO

## 2020-05-18 ENCOUNTER — Ambulatory Visit: Payer: Medicare PPO

## 2020-06-23 ENCOUNTER — Ambulatory Visit: Payer: Medicare PPO

## 2020-08-13 ENCOUNTER — Ambulatory Visit
Admission: RE | Admit: 2020-08-13 | Discharge: 2020-08-13 | Disposition: A | Discharge status: Home / Self Care | Payer: Medicare PPO | Source: Ambulatory Visit | Attending: Obstetrics and Gynecology | Admitting: Obstetrics and Gynecology

## 2020-08-13 ENCOUNTER — Other Ambulatory Visit: Payer: Self-pay

## 2020-08-13 DIAGNOSIS — Z1231 Encounter for screening mammogram for malignant neoplasm of breast: Secondary | ICD-10-CM

## 2021-08-05 ENCOUNTER — Other Ambulatory Visit: Payer: Self-pay | Admitting: Obstetrics and Gynecology

## 2021-08-05 DIAGNOSIS — Z1231 Encounter for screening mammogram for malignant neoplasm of breast: Secondary | ICD-10-CM

## 2021-08-15 ENCOUNTER — Ambulatory Visit
Admission: RE | Admit: 2021-08-15 | Discharge: 2021-08-15 | Disposition: A | Discharge status: Home / Self Care | Payer: Medicare PPO | Source: Ambulatory Visit | Attending: Obstetrics and Gynecology | Admitting: Obstetrics and Gynecology

## 2021-08-15 DIAGNOSIS — Z1231 Encounter for screening mammogram for malignant neoplasm of breast: Secondary | ICD-10-CM

## 2022-01-13 ENCOUNTER — Emergency Department (HOSPITAL_BASED_OUTPATIENT_CLINIC_OR_DEPARTMENT_OTHER): Payer: Medicare PPO

## 2022-01-13 ENCOUNTER — Other Ambulatory Visit: Payer: Self-pay

## 2022-01-13 ENCOUNTER — Emergency Department (HOSPITAL_BASED_OUTPATIENT_CLINIC_OR_DEPARTMENT_OTHER)
Admission: EM | Admit: 2022-01-13 | Discharge: 2022-01-13 | Disposition: A | Discharge status: Home / Self Care | Payer: Medicare PPO | Attending: Emergency Medicine | Admitting: Emergency Medicine

## 2022-01-13 ENCOUNTER — Encounter (HOSPITAL_BASED_OUTPATIENT_CLINIC_OR_DEPARTMENT_OTHER): Payer: Self-pay | Admitting: Urology

## 2022-01-13 DIAGNOSIS — S52502A Unspecified fracture of the lower end of left radius, initial encounter for closed fracture: Secondary | ICD-10-CM | POA: Insufficient documentation

## 2022-01-13 DIAGNOSIS — S6992XA Unspecified injury of left wrist, hand and finger(s), initial encounter: Secondary | ICD-10-CM | POA: Diagnosis present

## 2022-01-13 DIAGNOSIS — W19XXXA Unspecified fall, initial encounter: Secondary | ICD-10-CM

## 2022-01-13 DIAGNOSIS — W010XXA Fall on same level from slipping, tripping and stumbling without subsequent striking against object, initial encounter: Secondary | ICD-10-CM | POA: Insufficient documentation

## 2022-01-13 NOTE — ED Triage Notes (Signed)
Pt states tripped at bank today at 1400, landed on left wrist  Normal ROM to left wrist  Swelling noted  Took Ibuprofen prior to arrival

## 2022-01-13 NOTE — ED Notes (Signed)
Patient is able to move finger in her left hand Wrist is swollen . Fingers are pink. Alert x4

## 2022-01-13 NOTE — Discharge Instructions (Signed)
Seen in the emergency room today with wrist pain after a fall.  There is a fracture of your wrist and we have placed you in a splint.  Please keep this clean and dry.  Please call your orthopedist today or first thing Monday for follow-up appointment in the coming week.  You may apply cool compress this evening to help reduce swelling and keep the injured wrist elevated above the level of your heart when at rest.

## 2022-01-13 NOTE — ED Provider Notes (Signed)
Emergency Department Provider Note   I have reviewed the triage vital signs and the nursing notes.   HISTORY  Chief Complaint Wrist Pain   HPI Kara Willis is a 81 y.o. female presents to the ED with left wrist pain and swelling after a fall.  Patient states that she tripped the rubber sole of her shoe while going up a bank at 2 PM.  She landed on her outstretched hands and is complaining of wrist pain.  No pain to the elbow or shoulder.  No head injury or loss of consciousness.  No numbness or weakness.  Pain and swelling are improved after taking ibuprofen PTA.    Past Medical History:  Diagnosis Date   Arthritis    DJD (degenerative joint disease)     Review of Systems  Constitutional: No fever/chills Cardiovascular: Denies chest pain. Respiratory: Denies shortness of breath. Gastrointestinal: No abdominal pain. Musculoskeletal: Left wrist pain.  ____________________________________________   PHYSICAL EXAM:  VITAL SIGNS: ED Triage Vitals  Enc Vitals Group     BP 01/13/22 1511 139/72     Pulse Rate 01/13/22 1511 66     Resp 01/13/22 1511 18     Temp 01/13/22 1511 98.2 F (36.8 C)     Temp Source 01/13/22 1511 Oral     SpO2 01/13/22 1511 (!) 81 %     Weight 01/13/22 1510 209 lb 7 oz (95 kg)     Height 01/13/22 1510 5\' 9"  (1.753 m)   Constitutional: Alert and oriented. Well appearing and in no acute distress. Eyes: Conjunctivae are normal.  Head: Atraumatic. Nose: No congestion/rhinnorhea. Mouth/Throat: Mucous membranes are moist.   Neck: No stridor.   Cardiovascular: Good peripheral circulation occluding capillary refill and 2+ radial pulses on the left.  Respiratory: Normal respiratory effort.   Gastrointestinal: No distention.  Musculoskeletal: Swelling to the dorsal aspect of the distal left wrist with preserved range of motion.  The extremity is not grossly malaligned.  No tenderness to the elbow or shoulder with preserved range of motion here.   No midline tenderness to the cervical spine. Neurologic:  Normal speech and language.  Normal sensation to the left hand and forearm.  Skin:  Skin is warm, dry and intact. No rash noted.  ____________________________________________  RADIOLOGY  DG Wrist Complete Left  Result Date: 01/13/2022 CLINICAL DATA:  Wrist injury after fall EXAM: LEFT WRIST - COMPLETE 3+ VIEW COMPARISON:  None Available. FINDINGS: Acute comminuted intra-articular fracture of the distal left radial metaphysis. The radial styloid is displaced volarly. Soft tissue swelling. IMPRESSION: Acute intra-articular fracture of the distal left radius. Electronically Signed   By: Placido Sou M.D.   On: 01/13/2022 15:29    ____________________________________________   PROCEDURES  Procedure(s) performed:   Procedures  None  ____________________________________________   INITIAL IMPRESSION / ASSESSMENT AND PLAN / ED COURSE  Pertinent labs & imaging results that were available during my care of the patient were reviewed by me and considered in my medical decision making (see chart for details).   This patient is Presenting for Evaluation of fall/wrist pain, which does require a range of treatment options, and is a complaint that involves a high risk of morbidity and mortality.  The Differential Diagnoses include rupture, dislocation, contusion, sprain, etc.  I did obtain Additional Historical Information from husband at bedside.  Radiologic Tests Ordered, included wrist x-ray. I independently interpreted the images and agree with radiology interpretation.   Medical Decision Making: Summary:  Patient presents  to the emergency department after [facility].  Initial recorded O2 level of 81% although on my assessment patient is 98% on room air in no distress.  She has a distal radius fracture which is grossly well aligned but will need close orthopedics follow-up.  Patient was placed in a sugar-tong splint.  Pain is well  controlled.  The extremity is neurovascularly intact.  She has an established relationship with an orthopedist and will call that office on Monday for a follow-up appointment next week.  Disposition: discharge  ____________________________________________  FINAL CLINICAL IMPRESSION(S) / ED DIAGNOSES  Final diagnoses:  Closed fracture of distal end of left radius, unspecified fracture morphology, initial encounter  Fall, initial encounter     Note:  This document was prepared using Dragon voice recognition software and may include unintentional dictation errors.  Alona Bene, MD, [facility]    Bobie Caris, Arlyss Repress, MD 01/13/22 1900

## 2022-05-16 ENCOUNTER — Other Ambulatory Visit: Payer: Self-pay | Admitting: Physician Assistant

## 2022-05-16 DIAGNOSIS — E2839 Other primary ovarian failure: Secondary | ICD-10-CM

## 2022-05-22 ENCOUNTER — Encounter: Payer: Self-pay | Admitting: Cardiovascular Disease

## 2022-05-22 NOTE — Progress Notes (Unsigned)
Cardiology Office Note:    Date:  05/23/2022   ID:  Kara Willis, DOB 18-Aug-1940, MRN [id number]  PCP:  Stephens Shire, MD   [place] Providers Cardiologist:  Mertie Moores, MD     Referring MD: Stephens Shire, MD   Chief Complaint  Patient presents with   Atrial Fibrillation         History of Present Illness:  05/23/22    Kara Willis is a 82 y.o. female with a hx of atrial fib .  We are asked to see her for further eval and managemet of her new atrial fib   Has been short of breath - gradually worsened since Oct.   She is completely asymptomatic from an A-fib standpoint.  She cannot cannot tell that her heart is beating irregularly. She was found to have atrial fibrillation on a routine physical.  She was started on metoprolol.  She was referred to [place] to help decide which anticoagulant she should take.  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   aspirin EC 325 MG EC tablet Take 1 tablet (325 mg total) by mouth 2 (two) times daily after a meal.   cholecalciferol (VITAMIN  D) 1000 units tablet Take 1,000 Units by mouth daily.   fluticasone (FLONASE) 50 MCG/ACT nasal spray Place 1 spray into both nostrils daily as needed.   latanoprost (XALATAN) 0.005 % ophthalmic solution Place 1 drop into both eyes at bedtime.   metoprolol succinate (TOPROL-XL) 25 MG 24 hr tablet Take 0.5 tablets by mouth daily.   pravastatin (PRAVACHOL) 40 MG tablet Take 40 mg by mouth daily.     Allergies:   Patient has no known allergies.   Social History   Socioeconomic History   Marital status: Married    Spouse name: Not on file   Number of children: Not on file   Years of education: Not on file   Highest education level: Not on file  Occupational History   Not on file  Tobacco Use   Smoking status: Never   Smokeless tobacco: Not on file  Substance and Sexual Activity   Alcohol use: Yes    Alcohol/week: 7.0 standard drinks of alcohol    Types: 7 Glasses of wine per week   Drug use: No   Sexual activity: Not on file  Other Topics Concern   Not on file  Social History Narrative   Not on file  Social Determinants of Health   Financial Resource Strain: Not on file  Food Insecurity: Not on file  Transportation Needs: Not on file  Physical Activity: Not on file  Stress: Not on file  Social Connections: Not on file     Family History: The patient's family history is not on file.  ROS:   Please see the history of present illness.     All other systems reviewed and are negative.  EKGs/Labs/Other Studies Reviewed:    The following studies were reviewed today:   EKG:  Feb. 13, 2024:  atrial fibrillation with a heart rate of 82.  Nonspecific ST and T wave abnormalities.  Recent Labs: No results found for requested labs within last 365 days.  Recent Lipid Panel No results found for: "CHOL", "TRIG", "HDL", "CHOLHDL", "VLDL", "LDLCALC", "LDLDIRECT"   Risk Assessment/Calculations:    CHA2DS2-VASc Score = 3    This indicates a 3.2% annual risk of stroke. The  patient's score is based upon: CHF History: 0 HTN History: 0 Diabetes History: 0 Stroke History: 0 Vascular Disease History: 0 Age Score: 2 Gender Score: 1        Physical Exam:    VS:  BP 116/78   Pulse 82   Ht 5' 9"$  (1.753 m)   Wt 211 lb 6.4 oz (95.9 kg)   SpO2 96%   BMI 31.22 kg/m     Wt Readings from Last 3 Encounters:  05/23/22 211 lb 6.4 oz (95.9 kg)  01/13/22 209 lb 7 oz (95 kg)  04/20/15 209 lb 8 oz (95 kg)     GEN:  Well nourished, well developed in no acute distress HEENT: Normal NECK: No JVD; No carotid bruits LYMPHATICS: No lymphadenopathy CARDIAC: irreg. Irreg.   RESPIRATORY:  Clear to auscultation without rales, wheezing or rhonchi  ABDOMEN: Soft, non-tender, non-distended MUSCULOSKELETAL:  No edema; No deformity  SKIN: Warm and dry NEUROLOGIC:  Alert and oriented x 3 PSYCHIATRIC:  Normal affect   ASSESSMENT:    1. PAF (paroxysmal atrial fibrillation) ([place])   2. Mixed hyperlipidemia    PLAN:    In order of problems listed above:  Atrial fibrillation: [name] presents for further evaluation and management of her newly diagnosed atrial fibrillation.  She is basically asymptomatic.  She cannot tell that her heart rate is irregular.  She does note some shortness of breath since October but thinks that it may be due to generalized deconditioning. Her CHA2DS2-VASc score is 3.  Will start her on Eliquis 5 mg twice a day.  Her rate is well-controlled.  Will get an echocardiogram for further evaluation of her LV function and valvular function.  Her TSH is normal.  Will see her back in the office in 4 to 6 weeks to set up a cardioversion assuming that she is still in atrial fibrillation.           Medication Adjustments/Labs and Tests Ordered: Current medicines are reviewed at length with the patient today.  Concerns regarding medicines are outlined above.  Orders Placed This Encounter  Procedures   ECHOCARDIOGRAM COMPLETE   Meds ordered this  encounter  Medications   apixaban (ELIQUIS) 5 MG TABS tablet    Sig: Take 1 tablet (5 mg total) by mouth 2 (two) times daily.    Dispense:  180 tablet    Refill:  3    Patient Instructions  Medication Instructions:  START Eliquis 76m twice daily *If you need a refill on your cardiac medications before your  next appointment, please call your pharmacy*  Lab Work: NONE If you have labs (blood work) drawn today and your tests are completely normal, you will receive your results only by: [place] (if you have MyChart) OR A paper copy in the mail If you have any lab test that is abnormal or we need to change your treatment, we will call you to review the results.  Testing/Procedures: ECHO Your physician has requested that you have an echocardiogram. Echocardiography is a painless test that uses sound waves to create images of your heart. It provides your doctor with information about the size and shape of your heart and how well your heart's chambers and valves are working. This procedure takes approximately one hour. There are no restrictions for this procedure. Please do NOT wear cologne, perfume, aftershave, or lotions (deodorant is allowed). Please arrive 15 minutes prior to your appointment time.  Follow-Up: At [facility], you and your health needs are our priority.  As part of our continuing mission to provide you with exceptional heart care, we have created designated Provider Care Teams.  These Care Teams include your primary Cardiologist (physician) and Advanced Practice Providers (APPs -  Physician Assistants and Nurse Practitioners) who all work together to provide you with the care you need, when you need it.  Your next appointment:   5 week(s)  Provider:   Mertie Moores, MD     Signed, Mertie Moores, MD  05/23/2022 2:11 PM    [place]

## 2022-05-23 ENCOUNTER — Ambulatory Visit: Payer: Medicare PPO | Attending: Cardiovascular Disease | Admitting: Cardiovascular Disease

## 2022-05-23 ENCOUNTER — Encounter: Payer: Self-pay | Admitting: Cardiovascular Disease

## 2022-05-23 VITALS — BP 116/78 | HR 82 | Ht 69.0 in | Wt 211.4 lb

## 2022-05-23 DIAGNOSIS — E782 Mixed hyperlipidemia: Secondary | ICD-10-CM | POA: Diagnosis not present

## 2022-05-23 DIAGNOSIS — I480 Paroxysmal atrial fibrillation: Secondary | ICD-10-CM | POA: Diagnosis not present

## 2022-05-23 MED ORDER — APIXABAN 5 MG PO TABS: 5.0000 mg | ORAL_TABLET | Freq: Two times a day (BID) | ORAL | 3 refills | Status: DC

## 2022-05-23 NOTE — Patient Instructions (Signed)
Medication Instructions:  START Eliquis 22m twice daily *If you need a refill on your cardiac medications before your next appointment, please call your pharmacy*  Lab Work: NONE If you have labs (blood work) drawn today and your tests are completely normal, you will receive your results only by: [place](if you have MyChart) OR A paper copy in the mail If you have any lab test that is abnormal or we need to change your treatment, we will call you to review the results.  Testing/Procedures: ECHO Your physician has requested that you have an echocardiogram. Echocardiography is a painless test that uses sound waves to create images of your heart. It provides your doctor with information about the size and shape of your heart and how well your heart's chambers and valves are working. This procedure takes approximately one hour. There are no restrictions for this procedure. Please do NOT wear cologne, perfume, aftershave, or lotions (deodorant is allowed). Please arrive 15 minutes prior to your appointment time.  Follow-Up: [facility] and your [facility] needs are our priority.  As part of our continuing mission to provide you with exceptional heart care, we have created designated Provider Care Teams.  These Care Teams include your primary Cardiologist (physician) and Advanced Practice Providers (APPs -  Physician Assistants and Nurse Practitioners) who all work together to provide you with the care you need, when you need it.  Your next appointment:   5 week(s)  Provider:   PMertie Moores MD

## 2022-05-25 ENCOUNTER — Ambulatory Visit (HOSPITAL_COMMUNITY): Payer: Medicare PPO | Attending: Cardiology

## 2022-05-25 DIAGNOSIS — E782 Mixed hyperlipidemia: Secondary | ICD-10-CM | POA: Diagnosis present

## 2022-05-25 DIAGNOSIS — I480 Paroxysmal atrial fibrillation: Secondary | ICD-10-CM | POA: Insufficient documentation

## 2022-05-25 LAB — ECHOCARDIOGRAM COMPLETE
Area-P 1/2: 3.88 cm2
MV M vel: 5.03 m/s
MV Peak grad: 101.3 mmHg
Radius: 0.5 cm
S' Lateral: 3.3 cm

## 2022-05-29 NOTE — Addendum Note (Signed)
Addended byDanielle Dess on: 05/29/2022 07:58 AM   Modules accepted: Orders

## 2022-06-05 ENCOUNTER — Ambulatory Visit
Admission: RE | Admit: 2022-06-05 | Discharge: 2022-06-05 | Disposition: A | Discharge status: Home / Self Care | Payer: Medicare PPO | Source: Ambulatory Visit | Attending: Physician Assistant | Admitting: Physician Assistant

## 2022-06-05 DIAGNOSIS — E2839 Other primary ovarian failure: Secondary | ICD-10-CM

## 2022-06-22 DIAGNOSIS — I480 Paroxysmal atrial fibrillation: Secondary | ICD-10-CM | POA: Insufficient documentation

## 2022-06-22 DIAGNOSIS — I4819 Other persistent atrial fibrillation: Secondary | ICD-10-CM | POA: Insufficient documentation

## 2022-06-22 NOTE — Progress Notes (Signed)
Cardiology Office Note:    Date:  06/23/2022  ID:  Kara Willis, DOB February 20, 1941, MRN [id number] [place] Prov[facility] Cardiologist:  Mertie Moores, MD      Patient Profile:   Persistent atrial fibrillation Mitral regurgitation  TTE 05/25/2022: EF 63, no RWMA, normal RVSF, normal PASP, moderate MR, moderate TR, mild AI, RAP 3 Hyperlipidemia     History of Present Illness:   Kara Willis is a 81 y.o. female returns for follow-up on atrial fibrillation. She was referred to Dr. Acie Fredrickson 05/23/2022 for further evaluation of new onset atrial fibrillation.  She was placed on low-dose metoprolol succinate for rate control and apixaban for anticoagulation.  Echocardiogram demonstrated normal EF, moderate MR, moderate TR, mild AI.  The plan is to proceed with cardioversion if she remains in atrial fibrillation.  She is here alone.  Over the past few months, she has noted dyspnea exertion.  This usually occurs with walking up steps.  She has not had any shortness of breath while doing water aerobics.  She did decrease her activity after breaking her wrist a few months ago.  She has not had chest pain, arm or jaw pain, syncope, orthopnea.  She does note some edema in her legs (left greater than right) that resolves with elevation.  Review of Systems  Gastrointestinal:  Negative for hematochezia and melena.  Genitourinary:  Negative for hematuria.   see HPI    Studies Reviewed:    EKG: Atrial fibrillation, HR 81, normal axis, QTc 446  Risk Assessment/Calculations:    CHA2DS2-VASc Score = 3   This indicates a 3.2% annual risk of stroke. The patient's score is based upon: CHF History: 0 HTN History: 0 Diabetes History: 0 Stroke History: 0 Vascular Disease History: 0 Age Score: 2 Gender Score: 1            Physical Exam:   VS:  BP 118/80   Pulse 81   Ht 5\' 9"  (1.753 m)   Wt 211 lb (95.7 kg)   SpO2 96%   BMI 31.16 kg/m    Wt Readings from Last 3 Encounters:   06/23/22 211 lb (95.7 kg)  05/23/22 211 lb 6.4 oz (95.9 kg)  01/13/22 209 lb 7 oz (95 kg)    Constitutional:      Appearance: Healthy appearance. Not in distress.  Neck:     Vascular: No carotid bruit. JVD normal.  Pulmonary:     Breath sounds: Normal breath sounds. No wheezing. No rales.  Cardiovascular:     Normal rate. Irregularly irregular rhythm. Normal S1. Normal S2.      Murmurs: There is no murmur.  Edema:    Peripheral edema present.    Pretibial: bilateral trace edema of the pretibial area. Abdominal:     Palpations: Abdomen is soft.       ASSESSMENT AND PLAN:   PAF (paroxysmal atrial fibrillation) ([place]) She remains in atrial fibrillation.  Heart rate is controlled.  She does note symptoms of shortness of breath with exertion.  Question if this is related to atrial fibrillation versus deconditioning due to decreased activity after her wrist fracture.  Risks and benefits of proceeding with cardioversion were explained.  She agrees to proceed.  She is not sure if she snores. She has not had daytime hypersomnolence or fatigue. Continue metoprolol succinate 12.5 mg daily, Eliquis 5 mg twice daily.   She can stop taking aspirin.   Proceed with DCCV next week BMET, CBC today If snoring or  apnea noted by family, will need sleep study F/u with AFib clinic after DCCV F/u with Dr. Acie Fredrickson or me in 3 mos.   Mitral regurgitation Moderate by echocardiogram. Plan repeat Echocardiogram in 1 year.  Pure hypercholesterolemia Managed by primary care. She remains on Pravastatin 40 mg once daily.      Shared Decision Making/Informed Consent The risks (stroke, cardiac arrhythmias rarely resulting in the need for a temporary or permanent pacemaker, skin irritation or burns and complications associated with conscious sedation including aspiration, arrhythmia, respiratory failure and death), benefits (restoration of normal sinus rhythm) and alternatives of a direct current cardioversion  were explained in detail to Kara Willis and she agrees to proceed.    Dispo:  Return in about 3 months (around 09/23/2022) for Routine Follow Up, w/ Dr. Acie Fredrickson, or Richardson Dopp, PA-C.  Signed, Richardson Dopp, PA-C

## 2022-06-22 NOTE — H&P (View-Only) (Signed)
Cardiology Office Note:    Date:  06/23/2022  ID:  Kara Willis, DOB May 01, 1940, MRN [id number] [place] Prov[facility] Cardiologist:  Mertie Moores, MD      Patient Profile:   Persistent atrial fibrillation Mitral regurgitation  TTE 05/25/2022: EF 63, no RWMA, normal RVSF, normal PASP, moderate MR, moderate TR, mild AI, RAP 3 Hyperlipidemia     History of Present Illness:   Kara Willis is a 82 y.o. female returns for follow-up on atrial fibrillation. She was referred to Dr. Acie Fredrickson 05/23/2022 for further evaluation of new onset atrial fibrillation.  She was placed on low-dose metoprolol succinate for rate control and apixaban for anticoagulation.  Echocardiogram demonstrated normal EF, moderate MR, moderate TR, mild AI.  The plan is to proceed with cardioversion if she remains in atrial fibrillation.  She is here alone.  Over the past few months, she has noted dyspnea exertion.  This usually occurs with walking up steps.  She has not had any shortness of breath while doing water aerobics.  She did decrease her activity after breaking her wrist a few months ago.  She has not had chest pain, arm or jaw pain, syncope, orthopnea.  She does note some edema in her legs (left greater than right) that resolves with elevation.  Review of Systems  Gastrointestinal:  Negative for hematochezia and melena.  Genitourinary:  Negative for hematuria.   see HPI    Studies Reviewed:    EKG: Atrial fibrillation, HR 81, normal axis, QTc 446  Risk Assessment/Calculations:    CHA2DS2-VASc Score = 3   This indicates a 3.2% annual risk of stroke. The patient's score is based upon: CHF History: 0 HTN History: 0 Diabetes History: 0 Stroke History: 0 Vascular Disease History: 0 Age Score: 2 Gender Score: 1            Physical Exam:   VS:  BP 118/80   Pulse 81   Ht 5\' 9"  (1.753 m)   Wt 211 lb (95.7 kg)   SpO2 96%   BMI 31.16 kg/m    Wt Readings from Last 3 Encounters:   06/23/22 211 lb (95.7 kg)  05/23/22 211 lb 6.4 oz (95.9 kg)  01/13/22 209 lb 7 oz (95 kg)    Constitutional:      Appearance: Healthy appearance. Not in distress.  Neck:     Vascular: No carotid bruit. JVD normal.  Pulmonary:     Breath sounds: Normal breath sounds. No wheezing. No rales.  Cardiovascular:     Normal rate. Irregularly irregular rhythm. Normal S1. Normal S2.      Murmurs: There is no murmur.  Edema:    Peripheral edema present.    Pretibial: bilateral trace edema of the pretibial area. Abdominal:     Palpations: Abdomen is soft.       ASSESSMENT AND PLAN:   PAF (paroxysmal atrial fibrillation) ([place]) She remains in atrial fibrillation.  Heart rate is controlled.  She does note symptoms of shortness of breath with exertion.  Question if this is related to atrial fibrillation versus deconditioning due to decreased activity after her wrist fracture.  Risks and benefits of proceeding with cardioversion were explained.  She agrees to proceed.  She is not sure if she snores. She has not had daytime hypersomnolence or fatigue. Continue metoprolol succinate 12.5 mg daily, Eliquis 5 mg twice daily.   She can stop taking aspirin.   Proceed with DCCV next week BMET, CBC today If snoring or  apnea noted by family, will need sleep study F/u with AFib clinic after DCCV F/u with Dr. Acie Fredrickson or me in 3 mos.   Mitral regurgitation Moderate by echocardiogram. Plan repeat Echocardiogram in 1 year.  Pure hypercholesterolemia Managed by primary care. She remains on Pravastatin 40 mg once daily.      Shared Decision Making/Informed Consent The risks (stroke, cardiac arrhythmias rarely resulting in the need for a temporary or permanent pacemaker, skin irritation or burns and complications associated with conscious sedation including aspiration, arrhythmia, respiratory failure and death), benefits (restoration of normal sinus rhythm) and alternatives of a direct current cardioversion  were explained in detail to Kara Willis and she agrees to proceed.    Dispo:  Return in about 3 months (around 09/23/2022) for Routine Follow Up, w/ Dr. Acie Fredrickson, or Richardson Dopp, PA-C.  Signed, Richardson Dopp, PA-C

## 2022-06-23 ENCOUNTER — Ambulatory Visit: Payer: Medicare PPO | Attending: Physician Assistant | Admitting: Physician Assistant

## 2022-06-23 ENCOUNTER — Encounter: Payer: Self-pay | Admitting: Physician Assistant

## 2022-06-23 VITALS — BP 118/80 | HR 81 | Ht 69.0 in | Wt 211.0 lb

## 2022-06-23 DIAGNOSIS — E7800 Pure hypercholesterolemia, unspecified: Secondary | ICD-10-CM | POA: Insufficient documentation

## 2022-06-23 DIAGNOSIS — I340 Nonrheumatic mitral (valve) insufficiency: Secondary | ICD-10-CM | POA: Diagnosis not present

## 2022-06-23 DIAGNOSIS — I480 Paroxysmal atrial fibrillation: Secondary | ICD-10-CM | POA: Diagnosis not present

## 2022-06-23 NOTE — Assessment & Plan Note (Signed)
Moderate by echocardiogram. Plan repeat Echocardiogram in 1 year.

## 2022-06-23 NOTE — Assessment & Plan Note (Signed)
She remains in atrial fibrillation.  Heart rate is controlled.  She does note symptoms of shortness of breath with exertion.  Question if this is related to atrial fibrillation versus deconditioning due to decreased activity after her wrist fracture.  Risks and benefits of proceeding with cardioversion were explained.  She agrees to proceed.  She is not sure if she snores. She has not had daytime hypersomnolence or fatigue. Continue metoprolol succinate 12.5 mg daily, Eliquis 5 mg twice daily.   She can stop taking aspirin.   Proceed with DCCV next week BMET, CBC today If snoring or apnea noted by family, will need sleep study F/u with AFib clinic after DCCV F/u with Dr. Acie Fredrickson or me in 3 mos.

## 2022-06-23 NOTE — Assessment & Plan Note (Signed)
Managed by primary care. She remains on Pravastatin 40 mg once daily.

## 2022-06-23 NOTE — Assessment & Plan Note (Signed)
>>  ASSESSMENT AND PLAN FOR PAF (PAROXYSMAL ATRIAL FIBRILLATION) ([facility]) WRITTEN ON 06/23/2022 11:00 AM BY Liahm Grivas T, PA-C  She remains in atrial fibrillation.  Heart rate is controlled.  She does note symptoms of shortness of breath with exertion.  Question if this is related to atrial fibrillation versus deconditioning due to decreased activity after her wrist fracture.  Risks and benefits of proceeding with cardioversion were explained.  She agrees to proceed.  She is not sure if she snores. She has not had daytime hypersomnolence or fatigue. Continue metoprolol succinate 12.5 mg daily, Eliquis 5 mg twice daily.   She can stop taking aspirin.   Proceed with DCCV next week BMET, CBC today If snoring or apnea noted by family, will need sleep study F/u with AFib clinic after DCCV F/u with Dr. Elease Hashimoto or me in 3 mos.

## 2022-06-23 NOTE — Patient Instructions (Addendum)
Medication Instructions:  Your physician has recommended you make the following change in your medication:   STOP Aspirin    *If you need a refill on your cardiac medications before your next appointment, please call your pharmacy*   Lab Work: TODAY:  BMET & CBC  If you have labs (blood work) drawn today and your tests are completely normal, you will receive your results only by: [place] (if you have MyChart) OR A paper copy in the mail If you have any lab test that is abnormal or we need to change your treatment, we will call you to review the results.   Testing/Procedures: Your physician has recommended that you have a Cardioversion (DCCV). Electrical Cardioversion uses a jolt of electricity to your heart either through paddles or wired patches attached to your chest. This is a controlled, usually prescheduled, procedure. Defibrillation is done under light anesthesia in the hospital, and you usually go home the day of the procedure. This is done to get your heart back into a normal rhythm. You are not awake for the procedure. Please see the instruction sheet given to you BELOW:      Dear Kara Willis  You are scheduled for a Cardioversion on Thursday, March 21 with Dr. Margaretann Loveless.  Please arrive at the [facility] (Main Entrance A) at [facility]: [address] [place], [place] [address] at 11:00 AM.   DIET:  Nothing to eat or drink after midnight except a sip of water with medications (see medication instructions below)  MEDICATION INSTRUCTIONS: You can take your regular medications with enough water to take them.  If you decide not to take the, YOU MUST TAKE THE ELIQUIS.. DO NOT MISS A DOSE OF THIS AT ALL.  Continue taking your anticoagulant (blood thinner): Apixaban (Eliquis).  You will need to continue this after your procedure until you are told by your provider that it is safe to stop.    LABS:  WILL BE DONE TODAY  FYI:  For your safety, and to allow  [place] to monitor your vital signs accurately during the surgery/procedure we request: If you have artificial nails, gel coating, SNS etc, please have those removed prior to your surgery/procedure. Not having the nail coverings /polish removed may result in cancellation or delay of your surgery/procedure.  You must have a responsible person to drive you home and stay in the waiting area during your procedure. Failure to do so could result in cancellation.  Bring your insurance cards.  *Special Note: Every effort is made to have your procedure done on time. Occasionally there are emergencies that occur at the hospital that may cause delays. Please be patient if a delay does occur.        Follow-Up: At [facility], you and your health needs are our priority.  As part of our continuing mission to provide you with exceptional heart care, we have created designated Provider Care Teams.  These Care Teams include your primary Cardiologist (physician) and Advanced Practice Providers (APPs -  Physician Assistants and Nurse Practitioners) who all work together to provide you with the care you need, when you need it.  We recommend signing up for the patient portal called "MyChart".  Sign up information is provided on this After Visit Summary.  MyChart is used to connect with patients for Virtual Visits (Telemedicine).  Patients are able to view lab/test results, encounter notes, upcoming appointments, etc.  Non-urgent messages can be sent to your provider as well.  To learn more about what you can do with MyChart, go to [email or web address].    Your next appointment:   1-2 WEEKS AFTER 06/29/22 IN THE AFIB CLINIC   3 MONTHS  Provider:   Mertie Moores, MD  or Richardson Dopp, PA-C         Other Instructions

## 2022-06-24 LAB — CBC
Hematocrit: 36.3 % (ref 34.0–46.6)
Hemoglobin: 12 g/dL (ref 11.1–15.9)
MCH: 32 pg (ref 26.6–33.0)
MCHC: 33.1 g/dL (ref 31.5–35.7)
MCV: 97 fL (ref 79–97)
Platelets: 237 10*3/uL (ref 150–450)
RBC: 3.75 x10E6/uL — ABNORMAL LOW (ref 3.77–5.28)
RDW: 11.8 % (ref 11.7–15.4)
WBC: 6.9 10*3/uL (ref 3.4–10.8)

## 2022-06-24 LAB — BASIC METABOLIC PANEL
BUN/Creatinine Ratio: 18 (ref 12–28)
BUN: 16 mg/dL (ref 8–27)
CO2: 25 mmol/L (ref 20–29)
Calcium: 9.5 mg/dL (ref 8.7–10.3)
Chloride: 104 mmol/L (ref 96–106)
Creatinine, Ser: 0.9 mg/dL (ref 0.57–1.00)
Glucose: 94 mg/dL (ref 70–99)
Potassium: 4.7 mmol/L (ref 3.5–5.2)
Sodium: 142 mmol/L (ref 134–144)
eGFR: 65 mL/min/{1.73_m2} (ref >59)

## 2022-06-29 ENCOUNTER — Encounter (HOSPITAL_COMMUNITY)
Admission: RE | Disposition: A | Discharge status: Home / Self Care | Payer: Self-pay | Source: Home / Self Care | Attending: Internal Medicine

## 2022-06-29 ENCOUNTER — Encounter (HOSPITAL_COMMUNITY): Payer: Self-pay | Admitting: Internal Medicine

## 2022-06-29 ENCOUNTER — Ambulatory Visit (HOSPITAL_COMMUNITY): Payer: Medicare PPO | Admitting: Certified Registered Nurse Anesthetist

## 2022-06-29 ENCOUNTER — Ambulatory Visit (HOSPITAL_BASED_OUTPATIENT_CLINIC_OR_DEPARTMENT_OTHER): Payer: Medicare PPO | Admitting: Certified Registered Nurse Anesthetist

## 2022-06-29 ENCOUNTER — Ambulatory Visit (HOSPITAL_COMMUNITY)
Admission: RE | Admit: 2022-06-29 | Discharge: 2022-06-29 | Disposition: A | Discharge status: Home / Self Care | Payer: Medicare PPO | Attending: Internal Medicine | Admitting: Internal Medicine

## 2022-06-29 DIAGNOSIS — Z79899 Other long term (current) drug therapy: Secondary | ICD-10-CM | POA: Insufficient documentation

## 2022-06-29 DIAGNOSIS — M1990 Unspecified osteoarthritis, unspecified site: Secondary | ICD-10-CM | POA: Diagnosis not present

## 2022-06-29 DIAGNOSIS — Z7901 Long term (current) use of anticoagulants: Secondary | ICD-10-CM | POA: Insufficient documentation

## 2022-06-29 DIAGNOSIS — E7800 Pure hypercholesterolemia, unspecified: Secondary | ICD-10-CM | POA: Diagnosis not present

## 2022-06-29 DIAGNOSIS — I340 Nonrheumatic mitral (valve) insufficiency: Secondary | ICD-10-CM | POA: Diagnosis not present

## 2022-06-29 DIAGNOSIS — I480 Paroxysmal atrial fibrillation: Secondary | ICD-10-CM | POA: Diagnosis present

## 2022-06-29 DIAGNOSIS — I083 Combined rheumatic disorders of mitral, aortic and tricuspid valves: Secondary | ICD-10-CM

## 2022-06-29 DIAGNOSIS — I4891 Unspecified atrial fibrillation: Secondary | ICD-10-CM | POA: Diagnosis not present

## 2022-06-29 HISTORY — PX: CARDIOVERSION: SHX1299

## 2022-06-29 SURGERY — CARDIOVERSION
Anesthesia: General

## 2022-06-29 MED ORDER — HYDROCORTISONE 1 % EX CREA
1.0000 | TOPICAL_CREAM | Freq: Three times a day (TID) | CUTANEOUS | Status: DC | PRN
  Filled 2022-06-29: qty 28

## 2022-06-29 MED ORDER — SODIUM CHLORIDE 0.9 % IV SOLN: INTRAVENOUS | Status: DC

## 2022-06-29 MED ORDER — LIDOCAINE 2% (20 MG/ML) 5 ML SYRINGE
INTRAMUSCULAR | Status: DC | PRN
  Administered 2022-06-29: 40 mg via INTRAVENOUS

## 2022-06-29 MED ORDER — SODIUM CHLORIDE 0.9 % IV SOLN: INTRAVENOUS | Status: DC | PRN

## 2022-06-29 MED ORDER — HYDROCORTISONE 1 % EX CREA: 1.0000 | TOPICAL_CREAM | Freq: Three times a day (TID) | CUTANEOUS | Status: DC | PRN

## 2022-06-29 MED ORDER — PROPOFOL 10 MG/ML IV BOLUS
INTRAVENOUS | Status: DC | PRN
  Administered 2022-06-29: 100 mg via INTRAVENOUS

## 2022-06-29 NOTE — Discharge Instructions (Signed)

## 2022-06-29 NOTE — Anesthesia Procedure Notes (Signed)
Procedure Name: General with mask airway Date/Time: 06/29/2022 11:56 AM  Performed by: Harden Mo, CRNAPre-anesthesia Checklist: Patient identified, Emergency Drugs available, Suction available and Patient being monitored Patient Re-evaluated:Patient Re-evaluated prior to induction Oxygen Delivery Method: Ambu bag Preoxygenation: Pre-oxygenation with 100% oxygen Induction Type: IV induction Placement Confirmation: positive ETCO2 and breath sounds checked- equal and bilateral Dental Injury: Teeth and Oropharynx as per pre-operative assessment

## 2022-06-29 NOTE — Transfer of Care (Signed)
Immediate Anesthesia Transfer of Care Note  Patient: Kara Willis  Procedure(s) Performed: CARDIOVERSION  Patient Location: Endoscopy Unit  Anesthesia Type:General  Level of Consciousness: awake and drowsy  Airway & Oxygen Therapy: Patient Spontanous Breathing  Post-op Assessment: Report given to RN, Post -op Vital signs reviewed and stable, and Patient moving all extremities X 4  Post vital signs: Reviewed and stable  Last Vitals:  Vitals Value Taken Time  BP 119/65   Temp    Pulse 64   Resp 17   SpO2 95     Last Pain:  Vitals:   06/29/22 1059  TempSrc: Temporal  PainSc: 0-No pain         Complications: No notable events documented.

## 2022-06-29 NOTE — Anesthesia Postprocedure Evaluation (Signed)
Anesthesia Post Note  Patient: Kara Willis  Procedure(s) Performed: CARDIOVERSION     Patient location during evaluation: PACU Anesthesia Type: General Level of consciousness: awake and alert Pain management: pain level controlled Vital Signs Assessment: post-procedure vital signs reviewed and stable Respiratory status: spontaneous breathing, nonlabored ventilation, respiratory function stable and patient connected to nasal cannula oxygen Cardiovascular status: blood pressure returned to baseline and stable Postop Assessment: no apparent nausea or vomiting Anesthetic complications: no  No notable events documented.  Last Vitals:  Vitals:   06/29/22 1225 06/29/22 1230  BP: 108/82 110/75  Pulse: 61 64  Resp: 17 14  Temp:    SpO2: 93% 97%    Last Pain:  Vitals:   06/29/22 1230  TempSrc:   PainSc: 0-No pain                 Tiajuana Amass

## 2022-06-29 NOTE — CV Procedure (Signed)
Procedure: Electrical Cardioversion Indications:  Atrial Fibrillation  Procedure Details:  Consent: Risks of procedure as well as the alternatives and risks of each were explained to the (patient/caregiver).  Consent for procedure obtained.  Time Out: Verified patient identification, verified procedure, site/side was marked, verified correct patient position, special equipment/implants available, medications/allergies/relevent history reviewed, required imaging and test results available. PERFORMED.  Patient placed on cardiac monitor, pulse oximetry, supplemental oxygen as necessary.  Sedation given:  propofol per anesthesia Pacer pads placed anterior and posterior chest.  Cardioverted 1 time(s).  Cardioversion with synchronized biphasic 120J shock.  Evaluation: Findings: Post procedure EKG shows: NSR Complications: None Patient did tolerate procedure well.  Time Spent Directly with the Patient:  20 minutes   Elouise Munroe 06/29/2022, 12:04 PM

## 2022-06-29 NOTE — Anesthesia Preprocedure Evaluation (Addendum)
Anesthesia Evaluation  Patient identified by MRN, date of birth, ID band Patient awake    Reviewed: Allergy & Precautions, NPO status , Patient's Chart, lab work & pertinent test results  Airway Mallampati: II  TM Distance: >3 FB Neck ROM: Full    Dental no notable dental hx. (+) Teeth Intact, Dental Advisory Given   Pulmonary    Pulmonary exam normal breath sounds clear to auscultation       Cardiovascular Normal cardiovascular exam+ dysrhythmias Atrial Fibrillation  Rhythm:Regular Rate:Normal   1. Left ventricular ejection fraction by 3D volume is 63 %. The left  ventricle has normal function. The left ventricle has no regional wall  motion abnormalities. Left ventricular diastolic parameters are  indeterminate.   2. Right ventricular systolic function is normal. The right ventricular  size is normal. There is normal pulmonary artery systolic pressure.   3. The mitral valve is normal in structure. Moderate mitral valve  regurgitation. No evidence of mitral stenosis.   4. Tricuspid valve regurgitation is moderate.   5. The aortic valve is normal in structure. Aortic valve regurgitation is  mild. No aortic stenosis is present.   6. The inferior vena cava is normal in size with greater than 50%  respiratory variability, suggesting right atrial pressure of 3 mmHg.      Neuro/Psych negative neurological ROS  negative psych ROS   GI/Hepatic negative GI ROS, Neg liver ROS,,,  Endo/Other  negative endocrine ROS    Renal/GU Lab Results      Component                Value               Date                      CREATININE               0.90                06/23/2022               K                        4.7                 06/23/2022                   Musculoskeletal  (+) Arthritis ,    Abdominal   Peds  Hematology Lab Results      Component                Value               Date                      WBC                       6.9                 06/23/2022                HGB                      12.0                06/23/2022  HCT                      36.3                06/23/2022                PLT                      237                 06/23/2022              Anesthesia Other Findings   Reproductive/Obstetrics                             Anesthesia Physical Anesthesia Plan  ASA: 3  Anesthesia Plan: General   Post-op Pain Management:    Induction: Intravenous  PONV Risk Score and Plan: Treatment may vary due to age or medical condition and Ondansetron  Airway Management Planned: Mask  Additional Equipment: None  Intra-op Plan:   Post-operative Plan:   Informed Consent: I have reviewed the patients History and Physical, chart, labs and discussed the procedure including the risks, benefits and alternatives for the proposed anesthesia with the patient or authorized representative who has indicated his/her understanding and acceptance.     Dental advisory given  Plan Discussed with: CRNA, Anesthesiologist and Surgeon  Anesthesia Plan Comments:        Anesthesia Quick Evaluation

## 2022-06-29 NOTE — Interval H&P Note (Signed)
History and Physical Interval Note:  06/29/2022 11:18 AM  Kara Willis  has presented today for surgery, with the diagnosis of AFIB.  The various methods of treatment have been discussed with the patient and family. After consideration of risks, benefits and other options for treatment, the patient has consented to  Procedure(s): CARDIOVERSION (N/A) as a surgical intervention.  The patient's history has been reviewed, patient examined, no change in status, stable for surgery.  I have reviewed the patient's chart and labs.  Questions were answered to the patient's satisfaction.     Elouise Munroe

## 2022-06-30 ENCOUNTER — Encounter (HOSPITAL_COMMUNITY): Payer: Self-pay | Admitting: Internal Medicine

## 2022-07-07 ENCOUNTER — Ambulatory Visit (HOSPITAL_COMMUNITY)
Admission: RE | Admit: 2022-07-07 | Discharge: 2022-07-07 | Disposition: A | Discharge status: Home / Self Care | Payer: Medicare PPO | Source: Ambulatory Visit | Attending: Nurse Practitioner | Admitting: Nurse Practitioner

## 2022-07-07 ENCOUNTER — Encounter (HOSPITAL_COMMUNITY): Payer: Self-pay | Admitting: Internal Medicine

## 2022-07-07 VITALS — BP 124/84 | HR 100 | Ht 69.0 in | Wt 210.8 lb

## 2022-07-07 DIAGNOSIS — I4819 Other persistent atrial fibrillation: Secondary | ICD-10-CM | POA: Diagnosis not present

## 2022-07-07 DIAGNOSIS — Z6831 Body mass index (BMI) 31.0-31.9, adult: Secondary | ICD-10-CM | POA: Diagnosis not present

## 2022-07-07 DIAGNOSIS — Z79899 Other long term (current) drug therapy: Secondary | ICD-10-CM | POA: Diagnosis not present

## 2022-07-07 DIAGNOSIS — E669 Obesity, unspecified: Secondary | ICD-10-CM | POA: Diagnosis not present

## 2022-07-07 DIAGNOSIS — D6869 Other thrombophilia: Secondary | ICD-10-CM

## 2022-07-07 DIAGNOSIS — Z006 Encounter for examination for normal comparison and control in clinical research program: Secondary | ICD-10-CM

## 2022-07-07 DIAGNOSIS — I480 Paroxysmal atrial fibrillation: Secondary | ICD-10-CM | POA: Diagnosis present

## 2022-07-07 DIAGNOSIS — Z7901 Long term (current) use of anticoagulants: Secondary | ICD-10-CM | POA: Diagnosis not present

## 2022-07-07 DIAGNOSIS — I083 Combined rheumatic disorders of mitral, aortic and tricuspid valves: Secondary | ICD-10-CM | POA: Insufficient documentation

## 2022-07-07 NOTE — Patient Instructions (Signed)
Increase metoprolol to 25mg once a day °

## 2022-07-07 NOTE — Progress Notes (Signed)
Primary Care Physician: Merwyn Katos Primary Cardiologist: Dr. Acie Fredrickson Primary Electrophysiologist: None Referring Physician:    [name] is a 82 y.o. female with a history of left knee replacement and atrial fibrillation who presents for consultation in the [facility].  The patient was initially diagnosed with atrial fibrillation as an incidental finding during routine physical on 05/12/22. She did not know she was in atrial fibrillation. She was started on Toprol 25 mg 1/2 tablet daily and already taking a baby ASA. She was seen by Cardiology on 05/23/22 and still in Afib. ASA was stopped. She was started on Eliquis 5 mg BID due to a CHADS2VASC score of 3. She is now s/p DCCV on 06/29/22 with successful conversion to NSR with 1 shock.  On evaluation today, she has been doing well since DCCV. She is in Afib today but states she actually feels better since the DCCV. She cannot tell me when she went back into Afib. She is doing water aerobics every Tuesday and Friday morning. Yesterday, she walked 1.25 miles without issue. She states she does not feel any shortness of breath or fatigue. She has been taking Toprol 1/2 tablet qhs. No missed doses of Eliquis.   Husband denies she snores. She feels well rested in the morning and denies feeling somnolent / fatigued throughout the day.  Today, she denies symptoms of palpitations, chest pain, shortness of breath, orthopnea, PND, lower extremity edema, dizziness, presyncope, syncope, snoring, daytime somnolence, bleeding, or neurologic sequela. The patient is tolerating medications without difficulties and is otherwise without complaint today.   Atrial Fibrillation Risk Factors:  she does not have symptoms or diagnosis of sleep apnea. she does not have a history of rheumatic fever. she does not have a history of alcohol use. The patient does not have a history of early familial atrial fibrillation or  other arrhythmias.  she has a BMI of Body mass index is 31.13 kg/m.Marland Kitchen Filed Weights   07/07/22 1112  Weight: 95.6 kg    No family history on file.   Atrial Fibrillation Management history:  Previous antiarrhythmic drugs: None Previous cardioversions: 06/29/22 Previous ablations: None CHADS2VASC score: 3 Anticoagulation history: Eliquis   Past Medical History:  Diagnosis Date   Arthritis    DJD (degenerative joint disease)    Past Surgical History:  Procedure Laterality Date   APPENDECTOMY     BREAST EXCISIONAL BIOPSY Left    left 1998   CARDIOVERSION N/A 06/29/2022   Procedure: CARDIOVERSION;  Surgeon: Elouise Munroe, MD;  Location[facility] ENDOSCOPY;  Service: Cardiovascular;  Laterality: N/A;   COLONOSCOPY     DILATION AND CURETTAGE OF UTERUS     TONSILLECTOMY     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     apixaban (ELIQUIS) 5 MG TABS tablet Take 1 tablet (5 mg total) by mouth 2 (two) times daily. 180 tablet 3   cholecalciferol (VITAMIN D) 1000 units tablet Take 1,000 Units by mouth daily.  latanoprost (XALATAN) 0.005 % ophthalmic solution Place 1 drop into both eyes at bedtime.  12   metoprolol succinate (TOPROL-XL) 25 MG 24 hr tablet Take 25 mg by mouth at bedtime.     Multiple Vitamin (MULTIVITAMIN WITH MINERALS) TABS tablet Take 1 tablet by mouth daily.     pravastatin (PRAVACHOL) 40 MG tablet Take 40 mg by mouth daily.     No current facility-administered medications for this  encounter.    No Known Allergies  Social History   Socioeconomic History   Marital status: Married    Spouse name: Not on file   Number of children: Not on file   Years of education: Not on file   Highest education level: Not on file  Occupational History   Not on file  Tobacco Use   Smoking status: Never   Smokeless tobacco: Never   Tobacco comments:    Never smoke 07/07/22  Substance and Sexual Activity   Alcohol use: Yes    Alcohol/week: 7.0 standard drinks of alcohol    Types: 7 Glasses of wine per week   Drug use: No   Sexual activity: Not on file  Other Topics Concern   Not on file  Social History Narrative   Not on file   Social Determinants of Health   Financial Resource Strain: Not on file  Food Insecurity: Not on file  Transportation Needs: Not on file  Physical Activity: Not on file  Stress: Not on file  Social Connections: Not on file  Intimate Partner Violence: Not on file    ROS- All systems are reviewed and negative except as per the HPI above.  Physical Exam: Vitals:   07/07/22 1112  BP: 124/84  Pulse: 100  Weight: 95.6 kg  Height: 5\' 9"  (1.753 m)    GEN- The patient is a well appearing female, alert and oriented x 3 today.   Head- normocephalic, atraumatic Eyes-  Sclera clear, conjunctiva pink Ears- hearing intact Oropharynx- clear Neck- supple  Lungs- Clear to ausculation bilaterally, normal work of breathing Heart- Irregular rate and rhythm, no murmurs, rubs or gallops  GI- soft, NT, ND, + BS Extremities- no clubbing, cyanosis, or edema MS- no significant deformity or atrophy Skin- no rash or lesion Psych- euthymic mood, full affect Neuro- strength and sensation are intact  Wt Readings from Last 3 Encounters:  07/07/22 95.6 kg  06/29/22 95.3 kg  06/23/22 95.7 kg    EKG today demonstrates   Afib HR 100 QRS 76 ms QT/Qtc 334/430 ms  Echo 05/25/22 demonstrated:  IMPRESSIONS    1. Left ventricular ejection fraction by 3D  volume is 63 %. The left  ventricle has normal function. The left ventricle has no regional wall  motion abnormalities. Left ventricular diastolic parameters are  indeterminate.   2. Right ventricular systolic function is normal. The right ventricular  size is normal. There is normal pulmonary artery systolic pressure.   3. The mitral valve is normal in structure. Moderate mitral valve  regurgitation. No evidence of mitral stenosis.   4. Tricuspid valve regurgitation is moderate.   5. The aortic valve is normal in structure. Aortic valve regurgitation is  mild. No aortic stenosis is present.   6. The inferior vena cava is normal in size with greater than 50%  respiratory variability, suggesting right atrial pressure of 3 mmHg.    Epic records are reviewed at length today  CHA2DS2-VASc Score = 3  The patient's score is based upon: CHF History: 0  HTN History: 0 Diabetes History: 0 Stroke History: 0 Vascular Disease History: 0 Age Score: 2 Gender Score: 1       ASSESSMENT AND PLAN: Persistent Atrial Fibrillation (ICD10:  I48.19) The patient's CHA2DS2-VASc score is 3, indicating a 3.2% annual risk of stroke.    S/p DCCV on 06/29/22. She is back in Afib today.   Education provided about Afib with visual diagram. Discussion about medication treatments and ablation in detail. After discussion, we will proceed with conservative observation at this time.  Potential anti arrhythmic medications could include flecainide, Multaq, Tikosyn, or Amiodarone. Would perform stress test prior to flecainide; contact ins prior to Multaq and initiate that medication 48 hrs before repeat DCCV or day of repeat DCCV.   We will increase her Toprol to 1 tablet (25 mg) qhs. She will monitor HR at home. F/u in 1 month.  2. Secondary Hypercoagulable State (ICD10:  D68.69) The patient is at significant risk for stroke/thromboembolism based upon her CHA2DS2-VASc Score of 3.  Continue Apixaban (Eliquis).   No missed doses.  3. Obesity Body mass index is 31.13 kg/m. Lifestyle modification was discussed at length including regular exercise and weight reduction. She is doing consistent walking and water aerobics on a weekly basis. No limitations    Follow up 1 month in Afib clinic.   Emily Filbert, PA-C [facility] [address] [place], [place] [address] [phone] 07/07/2022 11:59 AM

## 2022-07-07 NOTE — Research (Signed)
[place] Informed Consent   Subject Name: Kara Willis  Subject met inclusion and exclusion criteria.  The informed consent form, study requirements and expectations were reviewed with the subject and questions and concerns were addressed prior to the signing of the consent form.  The subject verbalized understanding of the trial requirements.  The subject agreed to participate in the [place] trial and signed the informed consent on 29/Mar/2024.  The informed consent was obtained prior to performance of any protocol-specific procedures for the subject.  A copy of the signed informed consent was given to the subject and a copy was placed in the subject's medical record.   [name] [name]

## 2022-07-12 ENCOUNTER — Ambulatory Visit: Payer: Medicare PPO | Admitting: Physician Assistant

## 2022-07-14 ENCOUNTER — Telehealth: Payer: Self-pay | Admitting: Cardiovascular Disease

## 2022-07-14 ENCOUNTER — Other Ambulatory Visit (HOSPITAL_COMMUNITY): Payer: Self-pay | Admitting: *Deleted

## 2022-07-14 ENCOUNTER — Other Ambulatory Visit: Payer: Self-pay

## 2022-07-14 DIAGNOSIS — E782 Mixed hyperlipidemia: Secondary | ICD-10-CM

## 2022-07-14 DIAGNOSIS — I480 Paroxysmal atrial fibrillation: Secondary | ICD-10-CM

## 2022-07-14 MED ORDER — APIXABAN 5 MG PO TABS: 5.0000 mg | ORAL_TABLET | Freq: Two times a day (BID) | ORAL | 3 refills | Status: DC

## 2022-07-14 MED ORDER — METOPROLOL SUCCINATE ER 25 MG PO TB24: 25.0000 mg | ORAL_TABLET | Freq: Every day | ORAL | 1 refills | Status: DC

## 2022-07-14 NOTE — Telephone Encounter (Signed)
Prescription refill request for Eliquis received. Indication: Afib  Last office visit: 07/07/22 Nelva Bush)  Scr: 0.90 (06/23/22)  Age: 82 Weight: 95.6kg  Appropriate dose. Refill sent.

## 2022-07-14 NOTE — Telephone Encounter (Signed)
error 

## 2022-08-08 ENCOUNTER — Ambulatory Visit (HOSPITAL_COMMUNITY): Payer: Medicare PPO | Admitting: Internal Medicine

## 2022-08-09 ENCOUNTER — Encounter (HOSPITAL_COMMUNITY): Payer: Self-pay | Admitting: Internal Medicine

## 2022-08-09 ENCOUNTER — Ambulatory Visit (HOSPITAL_COMMUNITY)
Admission: RE | Admit: 2022-08-09 | Discharge: 2022-08-09 | Disposition: A | Discharge status: Home / Self Care | Payer: Medicare PPO | Source: Ambulatory Visit | Attending: Internal Medicine | Admitting: Internal Medicine

## 2022-08-09 VITALS — BP 136/88 | HR 74 | Ht 69.0 in | Wt 210.0 lb

## 2022-08-09 DIAGNOSIS — D6869 Other thrombophilia: Secondary | ICD-10-CM | POA: Diagnosis not present

## 2022-08-09 DIAGNOSIS — Z7901 Long term (current) use of anticoagulants: Secondary | ICD-10-CM | POA: Insufficient documentation

## 2022-08-09 DIAGNOSIS — I509 Heart failure, unspecified: Secondary | ICD-10-CM | POA: Diagnosis not present

## 2022-08-09 DIAGNOSIS — I4819 Other persistent atrial fibrillation: Secondary | ICD-10-CM

## 2022-08-09 DIAGNOSIS — Z7982 Long term (current) use of aspirin: Secondary | ICD-10-CM | POA: Diagnosis not present

## 2022-08-09 DIAGNOSIS — Z6831 Body mass index (BMI) 31.0-31.9, adult: Secondary | ICD-10-CM | POA: Diagnosis not present

## 2022-08-09 DIAGNOSIS — I110 Hypertensive heart disease with heart failure: Secondary | ICD-10-CM | POA: Diagnosis not present

## 2022-08-09 DIAGNOSIS — E669 Obesity, unspecified: Secondary | ICD-10-CM | POA: Insufficient documentation

## 2022-08-09 DIAGNOSIS — Z79899 Other long term (current) drug therapy: Secondary | ICD-10-CM | POA: Insufficient documentation

## 2022-08-09 MED ORDER — AMIODARONE HCL 200 MG PO TABS: ORAL_TABLET | ORAL | 0 refills | Status: DC

## 2022-08-09 NOTE — Patient Instructions (Addendum)
Start Amiodarone 200mg  twice a day for 1 month then we will reduce to once a day   Cardioversion scheduled for: Friday, May 24th   - Arrive at the [facility] and go to admitting at 915am   - Do not eat or drink anything after midnight the night prior to your procedure.   - Take all your morning medication (except diabetic medications) with a sip of water prior to arrival.  - You will not be able to drive home after your procedure.    - Do NOT miss any doses of your blood thinner - if you should miss a dose please notify our office immediately.   - If you feel as if you go back into normal rhythm prior to scheduled cardioversion, please notify our office immediately.   If your procedure is canceled in the cardioversion suite you will be charged a cancellation fee.

## 2022-08-09 NOTE — H&P (View-Only) (Signed)
  Primary Care Physician: Williams, Breejante J, PA-C Primary Cardiologist: Dr. Nahser Primary Electrophysiologist: None Referring Physician:    Kara Willis is a 81 y.o. female with a history of left knee replacement and atrial fibrillation who presents for consultation in the  Atrial Fibrillation Clinic.  The patient was initially diagnosed with atrial fibrillation as an incidental finding during routine physical on 05/12/22. She did not know she was in atrial fibrillation. She was started on Toprol 25 mg 1/2 tablet daily and already taking a baby ASA. She was seen by Cardiology on 05/23/22 and still in Afib. ASA was stopped. She was started on Eliquis 5 mg BID due to a CHADS2VASC score of 3. She is now s/p DCCV on 06/29/22 with successful conversion to NSR with 1 shock.  On evaluation 07/07/22, she has been doing well since DCCV. She is in Afib today but states she actually feels better since the DCCV. She cannot tell me when she went back into Afib. She is doing water aerobics every Tuesday and Friday morning. Yesterday, she walked 1.25 miles without issue. She states she does not feel any shortness of breath or fatigue. She has been taking Toprol 1/2 tablet qhs. No missed doses of Eliquis.   Husband denies she snores. She feels well rested in the morning and denies feeling somnolent / fatigued throughout the day.  On follow up today 08/09/22, she is currently in Afib. She still does her exercises without issue but does periodically get out of breath when going up stairs. FitBit watch has shown she has remained in Afib. She has thought over options previously talked about and is interested in ablation. She has been compliant with Eliquis.   Today, she denies symptoms of palpitations, chest pain, shortness of breath, orthopnea, PND, lower extremity edema, dizziness, presyncope, syncope, snoring, daytime somnolence, bleeding, or neurologic sequela. The patient is tolerating medications  without difficulties and is otherwise without complaint today.   Atrial Fibrillation Risk Factors:  she does not have symptoms or diagnosis of sleep apnea. she does not have a history of rheumatic fever. she does not have a history of alcohol use. The patient does not have a history of early familial atrial fibrillation or other arrhythmias.  she has a BMI of Body mass index is 31.13 kg/m.. There were no vitals filed for this visit.   No family history on file.   Atrial Fibrillation Management history:  Previous antiarrhythmic drugs: None Previous cardioversions: 06/29/22 Previous ablations: None CHADS2VASC score: 3 Anticoagulation history: Eliquis   Past Medical History:  Diagnosis Date   Arthritis    DJD (degenerative joint disease)    Past Surgical History:  Procedure Laterality Date   APPENDECTOMY     BREAST EXCISIONAL BIOPSY Left    left 1998   CARDIOVERSION N/A 06/29/2022   Procedure: CARDIOVERSION;  Surgeon: Acharya, Gayatri A, MD;  Location: [facility] ENDOSCOPY;  Service: Cardiovascular;  Laterality: N/A;   COLONOSCOPY     DILATION AND CURETTAGE OF UTERUS     TONSILLECTOMY     TOTAL KNEE ARTHROPLASTY Left 04/24/2014   Procedure: LEFT TOTAL KNEE ARTHROPLASTY;  Surgeon: Peter G Dalldorf, MD;  Location: [facility] OR;  Service: Orthopedics;  Laterality: Left;   TOTAL KNEE ARTHROPLASTY Right 04/20/2015   Procedure: TOTAL KNEE ARTHROPLASTY;  Surgeon: Peter Dalldorf, MD;  Location: [facility] OR;  Service: Orthopedics;  Laterality: Right;    Current Outpatient Medications  Medication Sig Dispense Refill   acetaminophen (TYLENOL) 500 MG tablet Take   500 mg by mouth every 6 (six) hours as needed for moderate pain.     apixaban (ELIQUIS) 5 MG TABS tablet Take 1 tablet (5 mg total) by mouth 2 (two) times daily. 180 tablet 3   cholecalciferol (VITAMIN D) 1000 units tablet Take 1,000 Units by mouth daily.     latanoprost (XALATAN) 0.005 % ophthalmic solution Place 1 drop into both eyes at  bedtime.  12   metoprolol succinate (TOPROL-XL) 25 MG 24 hr tablet Take 1 tablet (25 mg total) by mouth at bedtime. 90 tablet 1   Multiple Vitamin (MULTIVITAMIN WITH MINERALS) TABS tablet Take 1 tablet by mouth daily.     pravastatin (PRAVACHOL) 40 MG tablet Take 40 mg by mouth daily.     No current facility-administered medications for this encounter.    No Known Allergies  Social History   Socioeconomic History   Marital status: Married    Spouse name: Not on file   Number of children: Not on file   Years of education: Not on file   Highest education level: Not on file  Occupational History   Not on file  Tobacco Use   Smoking status: Never   Smokeless tobacco: Never   Tobacco comments:    Never smoke 07/07/22  Substance and Sexual Activity   Alcohol use: Not Currently    Alcohol/week: 7.0 standard drinks of alcohol    Types: 7 Glasses of wine per week   Drug use: No   Sexual activity: Not on file  Other Topics Concern   Not on file  Social History Narrative   Not on file   Social Determinants of Health   Financial Resource Strain: Not on file  Food Insecurity: Not on file  Transportation Needs: Not on file  Physical Activity: Not on file  Stress: Not on file  Social Connections: Not on file  Intimate Partner Violence: Not on file    ROS- All systems are reviewed and negative except as per the HPI above.  Physical Exam: Vitals:   08/09/22 0918  Height: 5' 9" (1.753 m)    GEN- The patient is well appearing, alert and oriented x 3 today.   Head- normocephalic, atraumatic Eyes-  Sclera clear, conjunctiva pink Ears- hearing intact Oropharynx- clear Neck- supple, no JVP Lymph- no cervical lymphadenopathy Lungs- Clear to ausculation bilaterally, normal work of breathing Heart- Irregular rate and rhythm, no murmurs, rubs or gallops, PMI not laterally displaced GI- soft, NT, ND, + BS Extremities- no clubbing, cyanosis, or edema MS- no significant deformity  or atrophy Skin- no rash or lesion Psych- euthymic mood, full affect Neuro- strength and sensation are intact   Wt Readings from Last 3 Encounters:  07/07/22 95.6 kg  06/29/22 95.3 kg  06/23/22 95.7 kg    EKG today demonstrates  Vent. rate 74 BPM PR interval * ms QRS duration 80 ms QT/QTcB 376/417 ms P-R-T axes * 58 55 Atrial fibrillation Low voltage QRS Nonspecific ST and T wave abnormality Abnormal ECG When compared with ECG of 07-Jul-2022 11:14, PREVIOUS ECG IS PRESENT  Echo 05/25/22 demonstrated:  IMPRESSIONS    1. Left ventricular ejection fraction by 3D volume is 63 %. The left  ventricle has normal function. The left ventricle has no regional wall  motion abnormalities. Left ventricular diastolic parameters are  indeterminate.   2. Right ventricular systolic function is normal. The right ventricular  size is normal. There is normal pulmonary artery systolic pressure.   3. The mitral valve is   normal in structure. Moderate mitral valve  regurgitation. No evidence of mitral stenosis.   4. Tricuspid valve regurgitation is moderate.   5. The aortic valve is normal in structure. Aortic valve regurgitation is  mild. No aortic stenosis is present.   6. The inferior vena cava is normal in size with greater than 50%  respiratory variability, suggesting right atrial pressure of 3 mmHg.    Epic records are reviewed at length today  CHA2DS2-VASc Score = 3  The patient's score is based upon: CHF History: 0 HTN History: 0 Diabetes History: 0 Stroke History: 0 Vascular Disease History: 0 Age Score: 2 Gender Score: 1       ASSESSMENT AND PLAN: Persistent Atrial Fibrillation (ICD10:  I48.19) The patient's CHA2DS2-VASc score is 3, indicating a 3.2% annual risk of stroke.    S/p DCCV on 06/29/22.  She is in Afib today. Watch appears to show she has remained in Afib since last OV.  We revisited options for treatment going forward. She is interested in speaking with  EP for ablation procedure. In order to help get her into NSR, will begin amiodarone as a bridge to ablation and schedule DCCV 3-4 weeks to allow sufficient time amiodarone load.  Begin amiodarone 200 mg BID x 1 month, then 200 mg daily. Schedule DCCV in 3-4 weeks. F/u 1-2 weeks after DCCV. Schedule appt with EP.   2. Secondary Hypercoagulable State (ICD10:  D68.69) The patient is at significant risk for stroke/thromboembolism based upon her CHA2DS2-VASc Score of 3.  Continue Apixaban (Eliquis).  No missed doses.  3. Obesity Body mass index is 31.13 kg/m. Lifestyle modification was discussed at length including regular exercise and weight reduction. She is doing consistent walking and water aerobics on a weekly basis. No limitations    Follow up 1-2 weeks after DCCV.   Ayomide Purdy, PA-C Afib Clinic [facility] [address] [place], [place] [address] [phone] 08/09/2022 9:27 AM 

## 2022-08-09 NOTE — Progress Notes (Signed)
Primary Care Physician: Bernita Buffy Primary Cardiologist: Dr. Elease Hashimoto Primary Electrophysiologist: None Referring Physician:    [name] is a 82 y.o. female with a history of left knee replacement and atrial fibrillation who presents for consultation in the [facility]ibr[facility].  The patient was initially diagnosed with atrial fibrillation as an incidental finding during routine physical on 05/12/22. She did not know she was in atrial fibrillation. She was started on Toprol 25 mg 1/2 tablet daily and already taking a baby ASA. She was seen by Cardiology on 05/23/22 and still in Afib. ASA was stopped. She was started on Eliquis 5 mg BID due to a CHADS2VASC score of 3. She is now s/p DCCV on 06/29/22 with successful conversion to NSR with 1 shock.  On evaluation 07/07/22, she has been doing well since DCCV. She is in Afib today but states she actually feels better since the DCCV. She cannot tell me when she went back into Afib. She is doing water aerobics every Tuesday and Friday morning. Yesterday, she walked 1.25 miles without issue. She states she does not feel any shortness of breath or fatigue. She has been taking Toprol 1/2 tablet qhs. No missed doses of Eliquis.   Husband denies she snores. She feels well rested in the morning and denies feeling somnolent / fatigued throughout the day.  On follow up today 08/09/22, she is currently in Afib. She still does her exercises without issue but does periodically get out of breath when going up stairs. FitBit watch has shown she has remained in Afib. She has thought over options previously talked about and is interested in ablation. She has been compliant with Eliquis.   Today, she denies symptoms of palpitations, chest pain, shortness of breath, orthopnea, PND, lower extremity edema, dizziness, presyncope, syncope, snoring, daytime somnolence, bleeding, or neurologic sequela. The patient is tolerating medications  without difficulties and is otherwise without complaint today.   Atrial Fibrillation Risk Factors:  she does not have symptoms or diagnosis of sleep apnea. she does not have a history of rheumatic fever. she does not have a history of alcohol use. The patient does not have a history of early familial atrial fibrillation or other arrhythmias.  she has a BMI of Body mass index is 31.13 kg/m.Marland Kitchen There were no vitals filed for this visit.   No family history on file.   Atrial Fibrillation Management history:  Previous antiarrhythmic drugs: None Previous cardioversions: 06/29/22 Previous ablations: None CHADS2VASC score: 3 Anticoagulation history: Eliquis   Past Medical History:  Diagnosis Date   Arthritis    DJD (degenerative joint disease)    Past Surgical History:  Procedure Laterality Date   APPENDECTOMY     BREAST EXCISIONAL BIOPSY Left    left 1998   CARDIOVERSION N/A 06/29/2022   Procedure: CARDIOVERSION;  Surgeon: Parke Poisson, MD;  Location: [facility] ENDOSCOPY;  Service: Cardiovascular;  Laterality: N/A;   COLONOSCOPY     DILATION AND CURETTAGE OF UTERUS     TONSILLECTOMY     TOTAL KNEE ARTHROPLASTY Left 04/24/2014   Procedure: LEFT TOTAL KNEE ARTHROPLASTY;  Surgeon: Velna Ochs, MD;  Location: [facility] OR;  Service: Orthopedics;  Laterality: Left;   TOTAL KNEE ARTHROPLASTY Right 04/20/2015   Procedure: TOTAL KNEE ARTHROPLASTY;  Surgeon: Marcene Corning, MD;  Location: [facility] OR;  Service: Orthopedics;  Laterality: Right;    Current Outpatient Medications  Medication Sig Dispense Refill   acetaminophen (TYLENOL) 500 MG tablet Take  500 mg by mouth every 6 (six) hours as needed for moderate pain.     apixaban (ELIQUIS) 5 MG TABS tablet Take 1 tablet (5 mg total) by mouth 2 (two) times daily. 180 tablet 3   cholecalciferol (VITAMIN D) 1000 units tablet Take 1,000 Units by mouth daily.     latanoprost (XALATAN) 0.005 % ophthalmic solution Place 1 drop into both eyes at  bedtime.  12   metoprolol succinate (TOPROL-XL) 25 MG 24 hr tablet Take 1 tablet (25 mg total) by mouth at bedtime. 90 tablet 1   Multiple Vitamin (MULTIVITAMIN WITH MINERALS) TABS tablet Take 1 tablet by mouth daily.     pravastatin (PRAVACHOL) 40 MG tablet Take 40 mg by mouth daily.     No current facility-administered medications for this encounter.    No Known Allergies  Social History   Socioeconomic History   Marital status: Married    Spouse name: Not on file   Number of children: Not on file   Years of education: Not on file   Highest education level: Not on file  Occupational History   Not on file  Tobacco Use   Smoking status: Never   Smokeless tobacco: Never   Tobacco comments:    Never smoke 07/07/22  Substance and Sexual Activity   Alcohol use: Not Currently    Alcohol/week: 7.0 standard drinks of alcohol    Types: 7 Glasses of wine per week   Drug use: No   Sexual activity: Not on file  Other Topics Concern   Not on file  Social History Narrative   Not on file   Social Determinants of Health   Financial Resource Strain: Not on file  Food Insecurity: Not on file  Transportation Needs: Not on file  Physical Activity: Not on file  Stress: Not on file  Social Connections: Not on file  Intimate Partner Violence: Not on file    ROS- All systems are reviewed and negative except as per the HPI above.  Physical Exam: Vitals:   08/09/22 0918  Height: 5\' 9"  (1.753 m)    GEN- The patient is well appearing, alert and oriented x 3 today.   Head- normocephalic, atraumatic Eyes-  Sclera clear, conjunctiva pink Ears- hearing intact Oropharynx- clear Neck- supple, no JVP Lymph- no cervical lymphadenopathy Lungs- Clear to ausculation bilaterally, normal work of breathing Heart- Irregular rate and rhythm, no murmurs, rubs or gallops, PMI not laterally displaced GI- soft, NT, ND, + BS Extremities- no clubbing, cyanosis, or edema MS- no significant deformity  or atrophy Skin- no rash or lesion Psych- euthymic mood, full affect Neuro- strength and sensation are intact   Wt Readings from Last 3 Encounters:  07/07/22 95.6 kg  06/29/22 95.3 kg  06/23/22 95.7 kg    EKG today demonstrates  Vent. rate 74 BPM PR interval * ms QRS duration 80 ms QT/QTcB 376/417 ms P-R-T axes * 58 55 Atrial fibrillation Low voltage QRS Nonspecific ST and T wave abnormality Abnormal ECG When compared with ECG of 07-Jul-2022 11:14, PREVIOUS ECG IS PRESENT  Echo 05/25/22 demonstrated:  IMPRESSIONS    1. Left ventricular ejection fraction by 3D volume is 63 %. The left  ventricle has normal function. The left ventricle has no regional wall  motion abnormalities. Left ventricular diastolic parameters are  indeterminate.   2. Right ventricular systolic function is normal. The right ventricular  size is normal. There is normal pulmonary artery systolic pressure.   3. The mitral valve is  normal in structure. Moderate mitral valve  regurgitation. No evidence of mitral stenosis.   4. Tricuspid valve regurgitation is moderate.   5. The aortic valve is normal in structure. Aortic valve regurgitation is  mild. No aortic stenosis is present.   6. The inferior vena cava is normal in size with greater than 50%  respiratory variability, suggesting right atrial pressure of 3 mmHg.    Epic records are reviewed at length today  CHA2DS2-VASc Score = 3  The patient's score is based upon: CHF History: 0 HTN History: 0 Diabetes History: 0 Stroke History: 0 Vascular Disease History: 0 Age Score: 2 Gender Score: 1       ASSESSMENT AND PLAN: Persistent Atrial Fibrillation (ICD10:  I48.19) The patient's CHA2DS2-VASc score is 3, indicating a 3.2% annual risk of stroke.    S/p DCCV on 06/29/22.  She is in Afib today. Watch appears to show she has remained in Afib since last OV.  We revisited options for treatment going forward. She is interested in speaking with  EP for ablation procedure. In order to help get her into NSR, will begin amiodarone as a bridge to ablation and schedule DCCV 3-4 weeks to allow sufficient time amiodarone load.  Begin amiodarone 200 mg BID x 1 month, then 200 mg daily. Schedule DCCV in 3-4 weeks. F/u 1-2 weeks after DCCV. Schedule appt with EP.   2. Secondary Hypercoagulable State (ICD10:  D68.69) The patient is at significant risk for stroke/thromboembolism based upon her CHA2DS2-VASc Score of 3.  Continue Apixaban (Eliquis).  No missed doses.  3. Obesity Body mass index is 31.13 kg/m. Lifestyle modification was discussed at length including regular exercise and weight reduction. She is doing consistent walking and water aerobics on a weekly basis. No limitations    Follow up 1-2 weeks after DCCV.   Lake Bells, PA-C Afib Clinic [facility] [address] [place], [place] [address] [phone] 08/09/2022 9:27 AM

## 2022-08-25 ENCOUNTER — Ambulatory Visit (HOSPITAL_COMMUNITY)
Admission: RE | Admit: 2022-08-25 | Discharge: 2022-08-25 | Disposition: A | Discharge status: Home / Self Care | Payer: Medicare PPO | Source: Ambulatory Visit | Attending: Internal Medicine | Admitting: Internal Medicine

## 2022-08-25 VITALS — HR 57

## 2022-08-25 DIAGNOSIS — I4819 Other persistent atrial fibrillation: Secondary | ICD-10-CM | POA: Insufficient documentation

## 2022-08-25 LAB — BASIC METABOLIC PANEL
Anion gap: 7 (ref 5–15)
BUN: 16 mg/dL (ref 8–23)
CO2: 28 mmol/L (ref 22–32)
Calcium: 9.3 mg/dL (ref 8.9–10.3)
Chloride: 103 mmol/L (ref 98–111)
Creatinine, Ser: 1.12 mg/dL — ABNORMAL HIGH (ref 0.44–1.00)
GFR, Estimated: 50 mL/min — ABNORMAL LOW (ref >60)
Glucose, Bld: 104 mg/dL — ABNORMAL HIGH (ref 70–99)
Potassium: 5.1 mmol/L (ref 3.5–5.1)
Sodium: 138 mmol/L (ref 135–145)

## 2022-08-25 LAB — CBC
HCT: 38.1 % (ref 36.0–46.0)
Hemoglobin: 12.3 g/dL (ref 12.0–15.0)
MCH: 32 pg (ref 26.0–34.0)
MCHC: 32.3 g/dL (ref 30.0–36.0)
MCV: 99.2 fL (ref 80.0–100.0)
Platelets: 215 10*3/uL (ref 150–400)
RBC: 3.84 MIL/uL — ABNORMAL LOW (ref 3.87–5.11)
RDW: 12.3 % (ref 11.5–15.5)
WBC: 5.2 10*3/uL (ref 4.0–10.5)
nRBC: 0 % (ref 0.0–0.2)

## 2022-08-25 MED ORDER — AMIODARONE HCL 200 MG PO TABS: ORAL_TABLET | ORAL | 1 refills | Status: DC

## 2022-08-25 NOTE — Progress Notes (Signed)
Patient notes some dizziness if she stands too fast since starting amiodarone. Discussion regarding going down to once daily dosage but she declines. She would like to continue taking amiodarone 200 mg BID. Rx refill sent.   Bmet and CBC drawn today.  ECG today shows   Vent. rate 57 BPM PR interval * ms QRS duration 80 ms QT/QTcB 434/422 ms P-R-T axes * 15 27 Atrial fibrillation with slow ventricular response Cannot rule out Anterior infarct , age undetermined Abnormal ECG When compared with ECG of 09-Aug-2022 09:26, PREVIOUS ECG IS PRESENT   Pt will follow up as scheduled for DCCV.

## 2022-08-25 NOTE — Patient Instructions (Addendum)
Amiodarone 200mg - starting 6/1 will start taking 1 tablet daily   Cardioversion scheduled for: 09/01/2022   - Arrive at the [facility] and go to admitting at 8:00am   - Do not eat or drink anything after midnight the night prior to your procedure.   - Take all your morning medication (except diabetic medications) with a sip of water prior to arrival.  - You will not be able to drive home after your procedure.    - Do NOT miss any doses of your blood thinner - if you should miss a dose please notify our office immediately.   - If you feel as if you go back into normal rhythm prior to scheduled cardioversion, please notify our office immediately.   If your procedure is canceled in the cardioversion suite you will be charged a cancellation fee.

## 2022-08-31 NOTE — Progress Notes (Signed)
Message left, Procedure scheduled for 0900, Please arrive at the hospital at 0800, NPO after midnight on Thursday, May take meds with sips of water in the AM, please have transportation for home post procedure, and someone to stay with pt for approximately 24 hours after  Pt informed if they haven't been taking eliquis routinely and as directed they need to call dr's office, or if they have missed doses

## 2022-09-01 ENCOUNTER — Ambulatory Visit (HOSPITAL_BASED_OUTPATIENT_CLINIC_OR_DEPARTMENT_OTHER): Payer: Medicare PPO | Admitting: Anesthesiology

## 2022-09-01 ENCOUNTER — Encounter (HOSPITAL_COMMUNITY)
Admission: RE | Disposition: A | Discharge status: Home / Self Care | Payer: Self-pay | Source: Home / Self Care | Attending: Cardiology

## 2022-09-01 ENCOUNTER — Ambulatory Visit (HOSPITAL_COMMUNITY): Payer: Medicare PPO | Admitting: Anesthesiology

## 2022-09-01 ENCOUNTER — Encounter (HOSPITAL_COMMUNITY): Payer: Self-pay | Admitting: Cardiology

## 2022-09-01 ENCOUNTER — Other Ambulatory Visit: Payer: Self-pay

## 2022-09-01 ENCOUNTER — Ambulatory Visit (HOSPITAL_COMMUNITY)
Admission: RE | Admit: 2022-09-01 | Discharge: 2022-09-01 | Disposition: A | Discharge status: Home / Self Care | Payer: Medicare PPO | Attending: Cardiology | Admitting: Cardiology

## 2022-09-01 DIAGNOSIS — I4891 Unspecified atrial fibrillation: Secondary | ICD-10-CM

## 2022-09-01 DIAGNOSIS — E669 Obesity, unspecified: Secondary | ICD-10-CM | POA: Diagnosis not present

## 2022-09-01 DIAGNOSIS — E785 Hyperlipidemia, unspecified: Secondary | ICD-10-CM

## 2022-09-01 DIAGNOSIS — Z7901 Long term (current) use of anticoagulants: Secondary | ICD-10-CM | POA: Diagnosis not present

## 2022-09-01 DIAGNOSIS — Z6831 Body mass index (BMI) 31.0-31.9, adult: Secondary | ICD-10-CM

## 2022-09-01 DIAGNOSIS — Z79899 Other long term (current) drug therapy: Secondary | ICD-10-CM | POA: Diagnosis not present

## 2022-09-01 DIAGNOSIS — Z96653 Presence of artificial knee joint, bilateral: Secondary | ICD-10-CM | POA: Insufficient documentation

## 2022-09-01 DIAGNOSIS — I4819 Other persistent atrial fibrillation: Secondary | ICD-10-CM | POA: Diagnosis present

## 2022-09-01 HISTORY — PX: CARDIOVERSION: SHX1299

## 2022-09-01 SURGERY — CARDIOVERSION
Anesthesia: General

## 2022-09-01 MED ORDER — SODIUM CHLORIDE 0.9 % IV SOLN: INTRAVENOUS | Status: DC | PRN

## 2022-09-01 MED ORDER — LIDOCAINE HCL (PF) 2 % IJ SOLN
INTRAMUSCULAR | Status: DC | PRN
  Administered 2022-09-01: 40 mg via INTRADERMAL

## 2022-09-01 MED ORDER — PROPOFOL 10 MG/ML IV BOLUS
INTRAVENOUS | Status: DC | PRN
  Administered 2022-09-01: 60 ug via INTRAVENOUS

## 2022-09-01 MED ORDER — SODIUM CHLORIDE 0.9 % IV SOLN: INTRAVENOUS | Status: DC

## 2022-09-01 SURGICAL SUPPLY — 1 items: ELECT DEFIB PAD ADLT CADENCE (PAD) ×2 IMPLANT

## 2022-09-01 NOTE — CV Procedure (Signed)
Procedure:   DCCV  Indication:  Symptomatic atrial fibrillation  Procedure Note:  The patient signed informed consent.  They have had had therapeutic anticoagulation with apixaban greater than 3 weeks.  Anesthesia was administered by Dr. Isaias Cowman.  Patient received 40 mg IV lidocaine and 60 mg IV propofol.Adequate airway was maintained throughout and vital followed per protocol.  They were cardioverted x 1 with 150J of biphasic synchronized energy.  They converted to sinus bradycardia in the 40s.  There were no apparent complications.  The patient had normal neuro status and respiratory status post procedure with vitals stable as recorded elsewhere.    Follow up:  They will continue on current medical therapy and follow up with cardiology as scheduled.  Jodelle Red, MD PhD 09/01/2022 8:55 AM

## 2022-09-01 NOTE — Anesthesia Preprocedure Evaluation (Addendum)
Anesthesia Evaluation  Patient identified by MRN, date of birth, ID band Patient awake    Reviewed: Allergy & Precautions, NPO status , Patient's Chart, lab work & pertinent test results  History of Anesthesia Complications Negative for: history of anesthetic complications  Airway Mallampati: III  TM Distance: >3 FB Neck ROM: Full    Dental  (+) Dental Advisory Given   Pulmonary neg pulmonary ROS   Pulmonary exam normal breath sounds clear to auscultation       Cardiovascular (-) hypertension(-) angina (-) Past MI, (-) Cardiac Stents and (-) CABG + dysrhythmias (on Eliquis, amiodarone, metoprolol) Atrial Fibrillation + Valvular Problems/Murmurs MR  Rhythm:Irregular Rate:Normal  HLD  TTE 05/25/2022: IMPRESSIONS     1. Left ventricular ejection fraction by 3D volume is 63 %. The left  ventricle has normal function. The left ventricle has no regional wall  motion abnormalities. Left ventricular diastolic parameters are  indeterminate.   2. Right ventricular systolic function is normal. The right ventricular  size is normal. There is normal pulmonary artery systolic pressure.   3. The mitral valve is normal in structure. Moderate mitral valve  regurgitation. No evidence of mitral stenosis.   4. Tricuspid valve regurgitation is moderate.   5. The aortic valve is normal in structure. Aortic valve regurgitation is  mild. No aortic stenosis is present.   6. The inferior vena cava is normal in size with greater than 50%  respiratory variability, suggesting right atrial pressure of 3 mmHg.     Neuro/Psych negative neurological ROS     GI/Hepatic negative GI ROS, Neg liver ROS,,,  Endo/Other  negative endocrine ROS    Renal/GU negative Renal ROS     Musculoskeletal  (+) Arthritis ,    Abdominal  (+) + obese  Peds  Hematology negative hematology ROS (+)   Anesthesia Other Findings K 5.1  Reproductive/Obstetrics                              Anesthesia Physical Anesthesia Plan  ASA: 3  Anesthesia Plan: General   Post-op Pain Management: Minimal or no pain anticipated   Induction: Intravenous  PONV Risk Score and Plan: 3 and Treatment may vary due to age or medical condition  Airway Management Planned: Natural Airway and Nasal Cannula  Additional Equipment:   Intra-op Plan:   Post-operative Plan:   Informed Consent: I have reviewed the patients History and Physical, chart, labs and discussed the procedure including the risks, benefits and alternatives for the proposed anesthesia with the patient or authorized representative who has indicated his/her understanding and acceptance.       Plan Discussed with: Anesthesiologist and CRNA  Anesthesia Plan Comments: (Risks of general anesthesia discussed including, but not limited to, sore throat, hoarse voice, chipped/damaged teeth, injury to vocal cords, nausea and vomiting, allergic reactions, lung infection, heart attack, stroke, and death. All questions answered. )        Anesthesia Quick Evaluation

## 2022-09-01 NOTE — Discharge Instructions (Signed)
Please stop metoprolol until you see Dr. Nelly Laurence. If your heart rate increases to >90 beats per minute at rest, call the office and we may recommend you restart this.

## 2022-09-01 NOTE — Transfer of Care (Signed)
Immediate Anesthesia Transfer of Care Note  Patient: Kara Willis  Procedure(s) Performed: CARDIOVERSION  Patient Location: Cath Lab  Anesthesia Type:General  Level of Consciousness: awake, alert , and oriented  Airway & Oxygen Therapy: Patient Spontanous Breathing and Patient connected to nasal cannula oxygen  Post-op Assessment: Report given to RN, Post -op Vital signs reviewed and stable, and Patient moving all extremities  Post vital signs: Reviewed and stable  Last Vitals:  Vitals Value Taken Time  BP 124/78 09/01/22 0857  Temp    Pulse 56 09/01/22 0857  Resp 17 09/01/22 0857  SpO2 95 % 09/01/22 0857    Last Pain:  Vitals:   09/01/22 0808  TempSrc:   PainSc: 0-No pain         Complications: No notable events documented.

## 2022-09-01 NOTE — Interval H&P Note (Signed)
History and Physical Interval Note:  09/01/2022 8:22 AM  [name]  has presented today for surgery, with the diagnosis of AFIB.  The various methods of treatment have been discussed with the patient and family. After consideration of risks, benefits and other options for treatment, the patient has consented to  Procedure(s): CARDIOVERSION (N/A) as a surgical intervention.  The patient's history has been reviewed, patient examined, no change in status, stable for surgery.  I have reviewed the patient's chart and labs.  Questions were answered to the patient's satisfaction.     Kara Willis

## 2022-09-01 NOTE — Anesthesia Postprocedure Evaluation (Signed)
Anesthesia Post Note  Patient: Kara Willis  Procedure(s) Performed: CARDIOVERSION     Patient location during evaluation: PACU Anesthesia Type: General Level of consciousness: awake Pain management: pain level controlled Vital Signs Assessment: post-procedure vital signs reviewed and stable Respiratory status: spontaneous breathing, nonlabored ventilation and respiratory function stable Cardiovascular status: blood pressure returned to baseline and stable Postop Assessment: no apparent nausea or vomiting Anesthetic complications: no   No notable events documented.  Last Vitals:  Vitals:   09/01/22 0920 09/01/22 0924  BP: 111/63 116/72  Pulse: (!) 54 (!) 46  Resp: 12 14  Temp:    SpO2: 96% 97%    Last Pain:  Vitals:   09/01/22 0924  TempSrc:   PainSc: 0-No pain                 Linton Rump

## 2022-09-05 ENCOUNTER — Encounter (HOSPITAL_COMMUNITY): Payer: Self-pay | Admitting: Cardiology

## 2022-09-05 ENCOUNTER — Ambulatory Visit: Payer: Medicare PPO | Attending: Cardiovascular Disease | Admitting: Cardiovascular Disease

## 2022-09-05 VITALS — BP 152/80 | HR 69 | Ht 69.0 in | Wt 212.0 lb

## 2022-09-05 DIAGNOSIS — I480 Paroxysmal atrial fibrillation: Secondary | ICD-10-CM | POA: Diagnosis not present

## 2022-09-05 NOTE — Progress Notes (Signed)
Electrophysiology Office Note:    Date:  09/05/2022   ID:  Kara Willis, DOB 12-23-1940, MRN [id number]  PCP:  Bernita Buffy   [facility] Providers Cardiologist:  Kristeen Miss, MD     Referring MD: Eustace Pen, PA-C   History of Present Illness:    Kara Willis is a 82 y.o. female with a hx listed below, significant for atrial fibrillation, referred for arrhythmia management.  She was found to have AF on a routine physical. She is not aware if she is in AF because she does not have palpitations, but she has had fatigue and dyspnea that correlate with AF. She had a cardioversion with return of AF, was started on amiodarone and has had a repeat DCCV with maintenance of sinus rhythm to-date.   Past Medical History:  Diagnosis Date   Arthritis    DJD (degenerative joint disease)     Past Surgical History:  Procedure Laterality Date   APPENDECTOMY     BREAST EXCISIONAL BIOPSY Left    left 1998   CARDIOVERSION N/A 06/29/2022   Procedure: CARDIOVERSION;  Surgeon: Parke Poisson, MD;  Location: [facility] ENDOSCOPY;  Service: Cardiovascular;  Laterality: N/A;   CARDIOVERSION N/A 09/01/2022   Procedure: CARDIOVERSION;  Surgeon: Jodelle Red, MD;  Location: [facility] INVASIVE CV LAB;  Service: Cardiovascular;  Laterality: N/A;   COLONOSCOPY     DILATION AND CURETTAGE OF UTERUS     TONSILLECTOMY     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   amiodarone (PACERONE) 200 MG tablet  Take 200 mg by mouth daily.   apixaban (ELIQUIS) 5 MG TABS tablet Take 1 tablet (5 mg total) by mouth 2 (two) times daily.   cholecalciferol (VITAMIN D) 1000 units tablet Take 1,000 Units by mouth daily.   latanoprost (XALATAN) 0.005 % ophthalmic solution Place 1 drop into both eyes at bedtime.   Multiple Vitamin (MULTIVITAMIN WITH MINERALS) TABS tablet Take 1 tablet by mouth daily.   pravastatin (PRAVACHOL) 40 MG tablet Take 40 mg by mouth daily.   [DISCONTINUED] amiodarone (PACERONE) 200 MG tablet Take 1 tablet (200 mg total) by mouth 2 (two) times daily for 14 days, THEN 1 tablet (200 mg total) daily. Take 1 tablet by mouth twice a day for 1 month then reduce to 1 tablet daily. (Patient taking differently: No sig reported)     Allergies:   Patient has no known allergies.   Social and Family History: Reviewed in Epic  ROS:   Please see the history of present illness.    All other systems reviewed and are negative.  EKGs/Labs/Other Studies Reviewed Today:    Echocardiogram:  TTE 05/25/2022 EF 60-65%. LA normal in size   Monitors:   Stress testing:   Advanced imaging:   Cardiac catherization   EKG:  Last EKG results: sinus rhythm, 69 bpm   Recent Labs: 08/25/2022: BUN 16; Creatinine, Ser 1.12; Hemoglobin 12.3; Platelets 215; Potassium 5.1; Sodium 138     Physical Exam:  VS:  BP (!) 152/80   Pulse 69   Ht 5\' 9"  (1.753 m)   Wt 212 lb (96.2 kg)   SpO2 98%   BMI 31.31 kg/m     Wt Readings from Last 3 Encounters:  09/05/22 212 lb (96.2 kg)  09/01/22 210 lb (95.3 kg)  08/09/22 210 lb (95.3 kg)     GEN: Well nourished, well developed in no acute distress CARDIAC: RRR, no murmurs, rubs, gallops RESPIRATORY:  Normal work of breathing MUSCULOSKELETAL: no edema    ASSESSMENT & PLAN:    Persistent atrial fibrillation Symptomatic -- though symptoms have improved recently  On amiodarone s/p cardioversion and doing well currently We discussed alternatives to  amiodarone; she prefers ablation. I recommended weight loss prior to the procedure, which will be scheduled likely for November  We discussed the indication, rationale, logistics, anticipated benefits, and potential risks of the ablation procedure including but not limited to -- bleed at the groin access site, chest pain, damage to nearby organs such as the diaphragm, lungs, or esophagus, need for a drainage tube, or prolonged hospitalization. I explained that the risk for stroke, heart attack, need for open chest surgery, or even death is very low but not zero. she  expressed understanding and wishes to proceed.   Secondary hypercoagulable state CHA2DS2Vasc 3 - continue eliquis 5mg         Medication Adjustments/Labs and Tests Ordered: Current medicines are reviewed at length with the patient today.  Concerns regarding medicines are outlined above.  No orders of the defined types were placed in this encounter.  No orders of the defined types were placed in this encounter.    Signed, Maurice Small, MD  09/05/2022 1:02 PM    [facility]

## 2022-09-05 NOTE — Patient Instructions (Signed)
Medication Instructions:  Your physician recommends that you continue on your current medications as directed. Please refer to the Current Medication list given to you today. *If you need a refill on your cardiac medications before your next appointment, please call your pharmacy*   Testing/Procedures: Atrial Fibrillation Ablation Your physician has recommended that you have an ablation. Catheter ablation is a medical procedure used to treat some cardiac arrhythmias (irregular heartbeats). During catheter ablation, a long, thin, flexible tube is put into a blood vessel in your groin (upper thigh), or neck. This tube is called an ablation catheter. It is then guided to your heart through the blood vessel. Radio frequency waves destroy small areas of heart tissue where abnormal heartbeats may cause an arrhythmia to start. Please see the instruction sheet given to you today.  You are scheduled for Atrial Fibrillation Ablation on Tuesday, November 19 with Dr. Halford Chessman.Please arrive at the Main Entrance A at [facility]: [address] [place], [place] [address]    Follow-Up: At [facility], you and your health needs are our priority.  As part of our continuing mission to provide you with exceptional heart care, we have created designated Provider Care Teams.  These Care Teams include your primary Cardiologist (physician) and Advanced Practice Providers (APPs -  Physician Assistants and Nurse Practitioners) who all work together to provide you with the care you need, when you need it.  We recommend signing up for the patient portal called "MyChart".  Sign up information is provided on this After Visit Summary.  MyChart is used to connect with patients for Virtual Visits (Telemedicine).  Patients are able to view lab/test results, encounter notes, upcoming appointments, etc.  Non-urgent messages can be sent to your provider as well.   To learn more about what you can do with MyChart, go  to [email or web address].    Your next appointment:   Monday, October 28th  Provider:   York Pellant, MD

## 2022-09-21 NOTE — Progress Notes (Signed)
  Cardiology Office Note:    Date:  09/22/2022  ID:  Kara Willis, DOB [birth year], MRN [id number] PCP: Bernita Buffy  [facility] Providers Cardiologist:  Kristeen Miss, MD Electrophysiologist:  Maurice Small, MD       Patient Profile:      Persistent atrial fibrillation S/p DCCV 06/29/22>>ERAF Amiodarone >> DCCV 09/01/22 Mitral regurgitation  TTE 05/25/2022: EF 63, no RWMA, normal RVSF, normal PASP, moderate MR, moderate TR, mild AI, RAP 3 Hyperlipidemia      History of Present Illness:   Kara Willis is a 82 y.o. female who returns for f/u of AFib. She had return of AFib after her DCCV in 06/2022. She was seen by the AFib Clinic and placed on Amiodarone. She had a repeat DCCV in 08/2022. She was recently seen by Dr. Nelly Laurence for EP consultation. She is to undergo PVI ablation for AFib in 02/2023. She is here alone. She has some questions about the ablation procedure. I explained it to her today. She has not had chest pain, shortness of breath, syncope, edema. She does get lightheaded at times. She is eager to pursue ablation to get off of the Amiodarone.   Review of Systems  Gastrointestinal:  Negative for hematochezia and melena.  Genitourinary:  Negative for hematuria.   See HPI    Studies Reviewed:    EKG:  not done  Risk Assessment/Calculations:    CHA2DS2-VASc Score = 3   This indicates a 3.2% annual risk of stroke. The patient's score is based upon: CHF History: 0 HTN History: 0 Diabetes History: 0 Stroke History: 0 Vascular Disease History: 0 Age Score: 2 Gender Score: 1            Physical Exam:   VS:  BP 130/70   Pulse 60   Ht 5\' 9"  (1.753 m)   Wt 204 lb 6.4 oz (92.7 kg)   SpO2 94%   BMI 30.18 kg/m    Wt Readings from Last 3 Encounters:  09/22/22 204 lb 6.4 oz (92.7 kg)  09/05/22 212 lb (96.2 kg)  09/01/22 210 lb (95.3 kg)    Constitutional:      Appearance: Healthy appearance. Not in distress.  Neck:      Vascular: JVD normal.  Pulmonary:     Breath sounds: Normal breath sounds. No wheezing. No rales.  Cardiovascular:     Bradycardia present. Regular rhythm.     Murmurs: There is no murmur.  Edema:    Peripheral edema absent.  Abdominal:     Palpations: Abdomen is soft.       ASSESSMENT AND PLAN:   Persistent atrial fibrillation (HCC) She seems to be maintaining NSR on exam. She has noted lightheadedness. Her HR on auscultation today is ~ 50. She has taken > 12 grams of Amiodarone thus far. I think she can maintain with a lower dose of Amiodarone. Decrease Amiodarone to 200 mg once daily except 100 mg on Sat, Sun. Hgb, Creatinine normal in May 2024. Based upon age, creatinine, weight, continue Eliquis 5 mg twice daily. F/u with Dr. Nelly Laurence as planned for [facility] ablation. F/u with me or Dr. Elease Hashimoto in 1 year.   Mitral regurgitation Mod MR by Echocardiogram in 05/2022. Repeat Echocardiogram in 05/2023.       Dispo:  Return in about 1 year (around 09/22/2023) for Routine Follow Up, w/ Dr. Elease Hashimoto, or Tereso Newcomer, PA-C.  Signed, Tereso Newcomer, PA-C

## 2022-09-22 ENCOUNTER — Encounter: Payer: Self-pay | Admitting: Physician Assistant

## 2022-09-22 ENCOUNTER — Ambulatory Visit: Payer: Medicare PPO | Attending: Physician Assistant | Admitting: Physician Assistant

## 2022-09-22 VITALS — BP 130/70 | HR 60 | Ht 69.0 in | Wt 204.4 lb

## 2022-09-22 DIAGNOSIS — I340 Nonrheumatic mitral (valve) insufficiency: Secondary | ICD-10-CM

## 2022-09-22 DIAGNOSIS — I4819 Other persistent atrial fibrillation: Secondary | ICD-10-CM

## 2022-09-22 MED ORDER — AMIODARONE HCL 200 MG PO TABS: ORAL_TABLET | ORAL | 3 refills | Status: DC

## 2022-09-22 NOTE — Assessment & Plan Note (Signed)
Mod MR by Echocardiogram in 05/2022. Repeat Echocardiogram in 05/2023.

## 2022-09-22 NOTE — Assessment & Plan Note (Signed)
She seems to be maintaining NSR on exam. She has noted lightheadedness. Her HR on auscultation today is ~ 50. She has taken > 12 grams of Amiodarone thus far. I think she can maintain with a lower dose of Amiodarone. Decrease Amiodarone to 200 mg once daily except 100 mg on Sat, Sun. Hgb, Creatinine normal in May 2024. Based upon age, creatinine, weight, continue Eliquis 5 mg twice daily. F/u with Dr. Nelly Laurence as planned for [facility] ablation. F/u with me or Dr. Elease Hashimoto in 1 year.

## 2022-09-22 NOTE — Patient Instructions (Signed)
Medication Instructions:  Your physician has recommended you make the following change in your medication:   REDUCE Amiodarone to 200 mg daily Monday-Friday, 1/2 tablet on Saturday & Sunday  *If you need a refill on your cardiac medications before your next appointment, please call your pharmacy*   Lab Work: None ordered  If you have labs (blood work) drawn today and your tests are completely normal, you will receive your results only by: MyChart Message (if you have MyChart) OR A paper copy in the mail If you have any lab test that is abnormal or we need to change your treatment, we will call you to review the results.   Testing/Procedures: Your physician has requested that you have an echocardiogram IN 05/2023. Echocardiography is a painless test that uses sound waves to create images of your heart. It provides your doctor with information about the size and shape of your heart and how well your heart's chambers and valves are working. This procedure takes approximately one hour. There are no restrictions for this procedure. Please do NOT wear cologne, perfume, aftershave, or lotions (deodorant is allowed). Please arrive 15 minutes prior to your appointment time.   Follow-Up: At [facility], you and your health needs are our priority.  As part of our continuing mission to provide you with exceptional heart care, we have created designated Provider Care Teams.  These Care Teams include your primary Cardiologist (physician) and Advanced Practice Providers (APPs -  Physician Assistants and Nurse Practitioners) who all work together to provide you with the care you need, when you need it.  We recommend signing up for the patient portal called "MyChart".  Sign up information is provided on this After Visit Summary.  MyChart is used to connect with patients for Virtual Visits (Telemedicine).  Patients are able to view lab/test results, encounter notes, upcoming appointments, etc.   Non-urgent messages can be sent to your provider as well.   To learn more about what you can do with MyChart, go to [email or web address].    Your next appointment:   1 year(s)  Provider:   Kristeen Miss, MD     Other Instructions

## 2022-10-23 ENCOUNTER — Telehealth: Payer: Self-pay | Admitting: Cardiovascular Disease

## 2022-10-23 NOTE — Telephone Encounter (Signed)
  Per MyChart scheduling message:  Initial Complaint:   Occurrence of dizziness, especially when walking upstairs; lethargy; low BP(<102); heaviness in moving my legs; I feel like I've run a marathon.You can reach me at [phone]   STAT if patient feels like he/she is going to faint   Are you dizzy now? No  Do you feel faint or have you passed out? Neither  Do you have any other symptoms? Now - headache.  This morning - heavy legs.  Have you checked your HR and BP (record if available)?  HR 95 this morning.  Now HR 46.  Don't have bp but can try now.

## 2022-10-24 NOTE — Telephone Encounter (Signed)
Spoke with patient about concerns of dizziness and low heart rates since starting on amiodarone. Patient has already discussed this issues with Tempie Donning at the AF clinic at her last visit and he decreased her amiodarone dose for her. Patient states that has improved on the days she takes less (Saturday and Sunday) but was wanting to let us know she was still experiencing some lightheadedness in the mornings after taking her amiodarone but not every day. Instructed patient to try taking it at night before bed to see if this improves her daytime dizziness. Patient will contact us back if this doesn't improve her symptoms with amiodarone. No further needs at this time

## 2022-11-01 ENCOUNTER — Telehealth: Payer: Self-pay | Admitting: Cardiovascular Disease

## 2022-11-01 NOTE — Telephone Encounter (Signed)
Received a message via patient schedule stating:  "This is a continuation of July 15th.  Nurse called me, after discussion suggested I change the time of day for taking Ami, with the intent of moving my dizzy spells from morning to night or bed time.  This did not work.  My dizziness starts in the morning and extends thru the day.  Today I went to water aerobics at 9:30, felt faint walking into the gym.  Got in the water, had no problems for one hour.  Walked to the car, no problem.  Thirty minutes later had dizzy spell getting out of the car, this continued for several hours.  Eventually the spell diminished but did not totally go away.   We need to find a way to eliminate this problem.  The earlier way of taking the Ami seemed to be better.  I think further reducing the dosage would be better as well.   I tried to get bp this morning, when I felt terrible, but the meter would not give a reading.  In the past this seems to be when my reading is too low for it to work.  Now, at 4:44pm, the bp is 134/79 and I feel fine."  Please advise.

## 2022-11-02 ENCOUNTER — Telehealth: Payer: Self-pay

## 2022-11-02 MED ORDER — AMIODARONE HCL 200 MG PO TABS: 100.0000 mg | ORAL_TABLET | Freq: Every day | ORAL | 3 refills | Status: DC

## 2022-11-02 NOTE — Telephone Encounter (Signed)
Spoke with patient, dizziness episodes haven't improved much with trying to take amiodarone at night. Dr Nelly Laurence states to decrease the dose to 100 mg once daily to see if symptoms improved. Sent in new prescription and informed patient. No further needs at this time

## 2022-11-14 DIAGNOSIS — H401131 Primary open-angle glaucoma, bilateral, mild stage: Secondary | ICD-10-CM | POA: Diagnosis not present

## 2022-11-28 NOTE — Telephone Encounter (Addendum)
Attempted to contact patient, unable to leave message. Can possibly move ablation up if date works for patient. Kara Willis or myself will attempt to follow up with her

## 2022-11-29 ENCOUNTER — Telehealth: Payer: Self-pay

## 2022-11-29 ENCOUNTER — Ambulatory Visit: Payer: Medicare PPO | Attending: Internal Medicine

## 2022-11-29 DIAGNOSIS — I480 Paroxysmal atrial fibrillation: Secondary | ICD-10-CM | POA: Diagnosis not present

## 2022-11-29 DIAGNOSIS — I4891 Unspecified atrial fibrillation: Secondary | ICD-10-CM

## 2022-11-29 NOTE — Telephone Encounter (Signed)
Pt's Afib Ablation with Dr. Nelly Laurence has been moved up to 8/30 at 10:00 AM. She will come to the [facility] office today to have labs drawn. I have moved her CT Scan up to 8/22 at 3:00 PM.   I will put Instruction letters up front for pt to pick up when she comes in for labs.

## 2022-11-30 ENCOUNTER — Encounter (HOSPITAL_COMMUNITY)
Admission: RE | Admit: 2022-11-30 | Discharge: 2022-11-30 | Disposition: A | Discharge status: Home / Self Care | Payer: Medicare PPO | Source: Ambulatory Visit | Attending: Cardiovascular Disease | Admitting: Cardiovascular Disease

## 2022-11-30 DIAGNOSIS — I480 Paroxysmal atrial fibrillation: Secondary | ICD-10-CM | POA: Insufficient documentation

## 2022-11-30 LAB — BASIC METABOLIC PANEL: Sodium: 141 mmol/L (ref 134–144)

## 2022-11-30 LAB — CBC: RDW: 12.5 % (ref 11.7–15.4)

## 2022-11-30 MED ORDER — IOHEXOL 350 MG/ML SOLN
100.0000 mL | Freq: Once | INTRAVENOUS | Status: AC | PRN
  Administered 2022-11-30: 100 mL via INTRAVENOUS

## 2022-12-07 NOTE — Pre-Procedure Instructions (Signed)
Attempted to call patient regarding procedure instructions.  Left voicemail on  the following items: Arrival time 0830 Nothing to eat or drink after midnight No meds AM of procedure Responsible person to drive you home and stay with you for 24 hrs  Have you missed any doses of anti-coagulant Eliquis- should be taken twice a day,  if you have missed any doses please let us know.  Don't take dose in the morning.

## 2022-12-08 ENCOUNTER — Ambulatory Visit (HOSPITAL_COMMUNITY): Payer: Medicare PPO | Admitting: Anesthesiology

## 2022-12-08 ENCOUNTER — Ambulatory Visit (HOSPITAL_COMMUNITY)
Admission: RE | Admit: 2022-12-08 | Discharge: 2022-12-08 | Disposition: A | Discharge status: Home / Self Care | Payer: Medicare PPO | Attending: Cardiovascular Disease | Admitting: Cardiovascular Disease

## 2022-12-08 ENCOUNTER — Encounter (HOSPITAL_COMMUNITY)
Admission: RE | Disposition: A | Discharge status: Home / Self Care | Payer: Self-pay | Source: Home / Self Care | Attending: Cardiovascular Disease

## 2022-12-08 DIAGNOSIS — I4819 Other persistent atrial fibrillation: Secondary | ICD-10-CM | POA: Diagnosis not present

## 2022-12-08 DIAGNOSIS — Z79899 Other long term (current) drug therapy: Secondary | ICD-10-CM | POA: Insufficient documentation

## 2022-12-08 DIAGNOSIS — I480 Paroxysmal atrial fibrillation: Secondary | ICD-10-CM

## 2022-12-08 DIAGNOSIS — E7800 Pure hypercholesterolemia, unspecified: Secondary | ICD-10-CM | POA: Diagnosis not present

## 2022-12-08 DIAGNOSIS — Z7901 Long term (current) use of anticoagulants: Secondary | ICD-10-CM | POA: Insufficient documentation

## 2022-12-08 DIAGNOSIS — I10 Essential (primary) hypertension: Secondary | ICD-10-CM | POA: Diagnosis not present

## 2022-12-08 DIAGNOSIS — I340 Nonrheumatic mitral (valve) insufficiency: Secondary | ICD-10-CM | POA: Diagnosis not present

## 2022-12-08 DIAGNOSIS — D6869 Other thrombophilia: Secondary | ICD-10-CM | POA: Insufficient documentation

## 2022-12-08 HISTORY — PX: ATRIAL FIBRILLATION ABLATION: EP1191

## 2022-12-08 SURGERY — ATRIAL FIBRILLATION ABLATION
Anesthesia: General

## 2022-12-08 MED ORDER — ACETAMINOPHEN 500 MG PO TABS
1000.0000 mg | ORAL_TABLET | Freq: Once | ORAL | Status: AC
  Administered 2022-12-08: 1000 mg via ORAL
  Filled 2022-12-08: qty 2

## 2022-12-08 MED ORDER — SODIUM CHLORIDE 0.9 % IV SOLN: 250.0000 mL | INTRAVENOUS | Status: DC | PRN

## 2022-12-08 MED ORDER — ONDANSETRON HCL 4 MG/2ML IJ SOLN: 4.0000 mg | Freq: Four times a day (QID) | INTRAMUSCULAR | Status: DC | PRN

## 2022-12-08 MED ORDER — HEPARIN (PORCINE) IN NACL 1000-0.9 UT/500ML-% IV SOLN
INTRAVENOUS | Status: DC | PRN
  Administered 2022-12-08 (×4): 500 mL

## 2022-12-08 MED ORDER — PROTAMINE SULFATE 10 MG/ML IV SOLN
INTRAVENOUS | Status: DC | PRN
  Administered 2022-12-08: 50 mg via INTRAVENOUS

## 2022-12-08 MED ORDER — ONDANSETRON HCL 4 MG/2ML IJ SOLN
INTRAMUSCULAR | Status: DC | PRN
  Administered 2022-12-08: 4 mg via INTRAVENOUS

## 2022-12-08 MED ORDER — LIDOCAINE 2% (20 MG/ML) 5 ML SYRINGE
INTRAMUSCULAR | Status: DC | PRN
  Administered 2022-12-08: 80 mg via INTRAVENOUS

## 2022-12-08 MED ORDER — DEXAMETHASONE SODIUM PHOSPHATE 10 MG/ML IJ SOLN
INTRAMUSCULAR | Status: DC | PRN
  Administered 2022-12-08: 10 mg via INTRAVENOUS

## 2022-12-08 MED ORDER — PHENYLEPHRINE HCL-NACL 20-0.9 MG/250ML-% IV SOLN
INTRAVENOUS | Status: DC | PRN
  Administered 2022-12-08: 30 ug/min via INTRAVENOUS

## 2022-12-08 MED ORDER — LACTATED RINGERS IV SOLN: INTRAVENOUS | Status: DC | PRN

## 2022-12-08 MED ORDER — SODIUM CHLORIDE 0.9 % IV SOLN: INTRAVENOUS | Status: DC

## 2022-12-08 MED ORDER — ATROPINE SULFATE 1 MG/ML IV SOLN
INTRAVENOUS | Status: DC | PRN
Start: 2022-12-08 — End: 2022-12-08
  Administered 2022-12-08: 1 mg via INTRAVENOUS

## 2022-12-08 MED ORDER — EPHEDRINE SULFATE-NACL 50-0.9 MG/10ML-% IV SOSY
PREFILLED_SYRINGE | INTRAVENOUS | Status: DC | PRN
  Administered 2022-12-08: 5 mg via INTRAVENOUS
  Administered 2022-12-08 (×2): 10 mg via INTRAVENOUS

## 2022-12-08 MED ORDER — ACETAMINOPHEN 325 MG PO TABS: 650.0000 mg | ORAL_TABLET | ORAL | Status: DC | PRN

## 2022-12-08 MED ORDER — FENTANYL CITRATE (PF) 100 MCG/2ML IJ SOLN
INTRAMUSCULAR | Status: DC | PRN
Start: 2022-12-08 — End: 2022-12-08
  Administered 2022-12-08 (×2): 50 ug via INTRAVENOUS

## 2022-12-08 MED ORDER — ROCURONIUM BROMIDE 10 MG/ML (PF) SYRINGE
PREFILLED_SYRINGE | INTRAVENOUS | Status: DC | PRN
  Administered 2022-12-08: 70 mg via INTRAVENOUS
  Administered 2022-12-08: 10 mg via INTRAVENOUS

## 2022-12-08 MED ORDER — PHENYLEPHRINE 80 MCG/ML (10ML) SYRINGE FOR IV PUSH (FOR BLOOD PRESSURE SUPPORT)
PREFILLED_SYRINGE | INTRAVENOUS | Status: DC | PRN
  Administered 2022-12-08: 160 ug via INTRAVENOUS

## 2022-12-08 MED ORDER — SODIUM CHLORIDE 0.9% FLUSH: 3.0000 mL | INTRAVENOUS | Status: DC | PRN

## 2022-12-08 MED ORDER — HEPARIN SODIUM (PORCINE) 1000 UNIT/ML IJ SOLN
INTRAMUSCULAR | Status: DC | PRN
Start: 2022-12-08 — End: 2022-12-08
  Administered 2022-12-08: 16000 [IU] via INTRAVENOUS

## 2022-12-08 MED ORDER — PROPOFOL 10 MG/ML IV BOLUS
INTRAVENOUS | Status: DC | PRN
  Administered 2022-12-08: 120 mg via INTRAVENOUS
  Administered 2022-12-08: 30 mg via INTRAVENOUS

## 2022-12-08 MED ORDER — FENTANYL CITRATE (PF) 100 MCG/2ML IJ SOLN
INTRAMUSCULAR | Status: AC
  Filled 2022-12-08: qty 2

## 2022-12-08 MED ORDER — SUGAMMADEX SODIUM 200 MG/2ML IV SOLN
INTRAVENOUS | Status: DC | PRN
  Administered 2022-12-08: 200 mg via INTRAVENOUS

## 2022-12-08 SURGICAL SUPPLY — 21 items
BAG SNAP BAND KOVER 36X36 (MISCELLANEOUS) IMPLANT
BLANKET WARM UNDERBOD FULL ACC (MISCELLANEOUS) ×2 IMPLANT
CABLE PFA RX CATH CONN (CABLE) IMPLANT
CATH FARAWAVE ABLATION 31 (CATHETERS) IMPLANT
CATH OCTARAY 2.0 F 3-3-3-3-3 (CATHETERS) IMPLANT
CATH SOUNDSTAR ECO 8FR (CATHETERS) IMPLANT
CATH WEBSTER BI DIR CS D-F CRV (CATHETERS) IMPLANT
CLOSURE PERCLOSE PROSTYLE (VASCULAR PRODUCTS) IMPLANT
COVER SWIFTLINK CONNECTOR (BAG) ×2 IMPLANT
DEVICE CLOSURE MYNXGRIP 6/7F (Vascular Products) IMPLANT
DILATOR VESSEL 38 20CM 16FR (INTRODUCER) IMPLANT
GUIDEWIRE INQWIRE 1.5J.035X260 (WIRE) IMPLANT
INQWIRE 1.5J .035X260CM (WIRE) ×1
PACK EP LATEX FREE (CUSTOM PROCEDURE TRAY) ×1
PACK EP LF (CUSTOM PROCEDURE TRAY) ×2 IMPLANT
PAD DEFIB RADIO PHYSIO CONN (PAD) ×2 IMPLANT
PATCH CARTO3 (PAD) IMPLANT
SHEATH FARADRIVE STEERABLE (SHEATH) IMPLANT
SHEATH PINNACLE 8F 10CM (SHEATH) IMPLANT
SHEATH PINNACLE 9F 10CM (SHEATH) IMPLANT
SHEATH WIRE KIT BAYLIS SL1 (KITS) IMPLANT

## 2022-12-08 NOTE — Anesthesia Procedure Notes (Signed)
Procedure Name: Intubation Date/Time: 12/08/2022 11:03 AM  Performed by: Collene Schlichter, MDPre-anesthesia Checklist: Patient identified, Emergency Drugs available, Suction available and Patient being monitored Patient Re-evaluated:Patient Re-evaluated prior to induction Oxygen Delivery Method: Circle system utilized Preoxygenation: Pre-oxygenation with 100% oxygen Induction Type: IV induction Ventilation: Mask ventilation without difficulty Laryngoscope Size: Mac and 3 Grade View: Grade II Tube type: Oral Tube size: 7.0 mm Number of attempts: 1 Airway Equipment and Method: Stylet and Oral airway Placement Confirmation: ETT inserted through vocal cords under direct vision, positive ETCO2 and breath sounds checked- equal and bilateral Secured at: 22 cm Tube secured with: Tape Dental Injury: Teeth and Oropharynx as per pre-operative assessment

## 2022-12-08 NOTE — Progress Notes (Signed)
Patients B/P is 174/92. Janne Napoleon, RN made aware.

## 2022-12-08 NOTE — Progress Notes (Signed)
Patient walked to the bathroom without difficulties, bilateral groin sites level 0, clean, dry, and intact.

## 2022-12-08 NOTE — H&P (Signed)
Electrophysiology Office Note:    Date:  12/08/2022   ID:  Kara Willis, DOB [birth year], MRN [id number]  PCP:  Bernita Buffy   [facility] Providers Cardiologist:  Kristeen Miss, MD Electrophysiologist:  Maurice Small, MD     Referring MD: No ref. provider found   History of Present Illness:    Kara Willis is a 82 y.o. female with a hx listed below, significant for atrial fibrillation, referred for arrhythmia management.  She was found to have AF on a routine physical. She is not aware if she is in AF because she does not have palpitations, but she has had fatigue and dyspnea that correlate with AF. She had a cardioversion with return of AF, was started on amiodarone and has had a repeat DCCV with maintenance of sinus rhythm to-date.   I reviewed the patient's CT and labs. There was no LAA thrombus. she  has not missed any doses of anticoagulation, and she took her dose last night. There have been no changes in the patient's diagnoses, medications, or condition since our recent clinic visit.  Past Medical History:  Diagnosis Date   Arthritis    DJD (degenerative joint disease)     Past Surgical History:  Procedure Laterality Date   APPENDECTOMY     BREAST EXCISIONAL BIOPSY Left    left 1998   CARDIOVERSION N/A 06/29/2022   Procedure: CARDIOVERSION;  Surgeon: Parke Poisson, MD;  Location: [facility] ENDOSCOPY;  Service: Cardiovascular;  Laterality: N/A;   CARDIOVERSION N/A 09/01/2022   Procedure: CARDIOVERSION;  Surgeon: Jodelle Red, MD;  Location: [facility] INVASIVE CV LAB;  Service: Cardiovascular;  Laterality: N/A;   COLONOSCOPY     DILATION AND CURETTAGE OF UTERUS     TONSILLECTOMY     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   amiodarone (PACERONE) 200 MG tablet Take 0.5 tablets (100 mg total) by mouth daily.   apixaban (ELIQUIS) 5 MG TABS tablet Take 1 tablet (5 mg total) by mouth 2 (two) times daily.   cholecalciferol (VITAMIN D) 1000 units tablet Take 1,000 Units by mouth daily.   latanoprost (XALATAN) 0.005 % ophthalmic solution Place 1 drop into both eyes at bedtime.   Multiple Vitamin (MULTIVITAMIN WITH MINERALS) TABS tablet Take 1 tablet by mouth daily.   pravastatin (PRAVACHOL) 40 MG tablet Take 40 mg by mouth daily.     Allergies:   Patient has no known allergies.   Social and Family History: Reviewed in Epic  ROS:   Please see the history of present illness.    All other systems reviewed and are negative.  EKGs/Labs/Other Studies Reviewed Today:    Echocardiogram:  TTE 05/25/2022 EF 60-65%. LA normal in size   Monitors:   Stress testing:   Advanced imaging:   Cardiac catherization   EKG:  Last EKG results: sinus rhythm, 69 bpm   Recent Labs: 11/29/2022: BUN 17; Creatinine, Ser 1.16; Hemoglobin 11.8; Platelets 233; Potassium 4.7; Sodium 141     Physical Exam:  VS:  BP (!) 182/84   Pulse (!) 56   Temp 98 F (36.7 C) (Temporal)   Resp 16   Ht 5\' 9"  (1.753 m)   Wt 86.2 kg   SpO2 97%   BMI 28.06 kg/m     Wt Readings from Last 3 Encounters:  12/08/22 86.2 kg  09/22/22 92.7 kg  09/05/22 96.2 kg     GEN: Well nourished, well developed in no acute distress CARDIAC: RRR, no murmurs, rubs, gallops RESPIRATORY:  Normal work of breathing MUSCULOSKELETAL: no edema    ASSESSMENT & PLAN:    Persistent atrial fibrillation Symptomatic -- though symptoms have improved recently  On amiodarone s/p  cardioversion and doing well currently We discussed alternatives to amiodarone; she prefers ablation. I recommended weight loss prior to the procedure, which will be scheduled likely for November  We discussed the indication, rationale, logistics, anticipated benefits, and potential risks of the ablation procedure including but not limited to -- bleed at the groin access site, chest pain, damage to nearby organs such as the diaphragm, lungs, or esophagus, need for a drainage tube, or prolonged hospitalization. I explained that the risk for stroke, heart attack, need for open chest surgery, or even death is very low but not zero. she  expressed understanding and wishes to proceed.   Secondary hypercoagulable state CHA2DS2Vasc 3 - continue eliquis 5mg         Medication Adjustments/Labs and Tests Ordered: Current medicines are reviewed at length with the patient today.  Concerns regarding medicines are outlined above.  Orders Placed This Encounter  Procedures   Informed Consent Details: Physician/Practitioner Attestation; Transcribe to consent form and obtain patient signature   Initiate Pre-op Protocol   Void on call to EP Lab   Confirm CBC and BMP (or CMP) results within 7 days for inpatient and 30 days for outpatient:   Clip right and left femoral area PM before surgery   Clip right internal jugular area PM before surgery   Pre-admission testing diagnosis   EP STUDY   Insert peripheral IV   Meds ordered this encounter  Medications   0.9 %  sodium chloride infusion   acetaminophen (TYLENOL) tablet 1,000 mg   fentaNYL (SUBLIMAZE) 100 MCG/2ML injection    Arrie Aran E: cabinet override     Signed, Maurice Small, MD  12/08/2022 10:47 AM    [facility]

## 2022-12-08 NOTE — Transfer of Care (Signed)
Immediate Anesthesia Transfer of Care Note  Patient: Kara Willis  Procedure(s) Performed: ATRIAL FIBRILLATION ABLATION  Patient Location: PACU and Cath Lab  Anesthesia Type:General  Level of Consciousness: awake and alert   Airway & Oxygen Therapy: Patient Spontanous Breathing and Patient connected to face mask oxygen  Post-op Assessment: Report given to RN and Post -op Vital signs reviewed and stable  Post vital signs: Reviewed and stable  Last Vitals:  Vitals Value Taken Time  BP 153/69 12/08/22 1313  Temp    Pulse 63 12/08/22 1315  Resp 15 12/08/22 1315  SpO2 98 % 12/08/22 1315  Vitals shown include unfiled device data.  Last Pain:  Vitals:   12/08/22 0922  TempSrc:   PainSc: 0-No pain         Complications: There were no known notable events for this encounter.

## 2022-12-08 NOTE — Discharge Instructions (Signed)

## 2022-12-08 NOTE — Anesthesia Preprocedure Evaluation (Addendum)
Anesthesia Evaluation  Patient identified by MRN, date of birth, ID band Patient awake    Reviewed: Allergy & Precautions, NPO status , Patient's Chart, lab work & pertinent test results  Airway Mallampati: III  TM Distance: >3 FB Neck ROM: Full    Dental  (+) Teeth Intact, Dental Advisory Given   Pulmonary neg pulmonary ROS   Pulmonary exam normal breath sounds clear to auscultation       Cardiovascular hypertension, Pt. on medications Normal cardiovascular exam+ dysrhythmias Atrial Fibrillation + Valvular Problems/Murmurs MR  Rhythm:Regular Rate:Normal  Echo 2/24: 1. Left ventricular ejection fraction by 3D volume is 63 %. The left  ventricle has normal function. The left ventricle has no regional wall  motion abnormalities. Left ventricular diastolic parameters are  indeterminate.   2. Right ventricular systolic function is normal. The right ventricular  size is normal. There is normal pulmonary artery systolic pressure.   3. The mitral valve is normal in structure. Moderate mitral valve  regurgitation. No evidence of mitral stenosis.   4. Tricuspid valve regurgitation is moderate.   5. The aortic valve is normal in structure. Aortic valve regurgitation is  mild. No aortic stenosis is present.   6. The inferior vena cava is normal in size with greater than 50%  respiratory variability, suggesting right atrial pressure of 3 mmHg.     Neuro/Psych negative neurological ROS     GI/Hepatic negative GI ROS, Neg liver ROS,,,  Endo/Other  negative endocrine ROS    Renal/GU negative Renal ROS     Musculoskeletal  (+) Arthritis ,    Abdominal   Peds  Hematology  (+) Blood dyscrasia (Eliquis)   Anesthesia Other Findings   Reproductive/Obstetrics                             Anesthesia Physical Anesthesia Plan  ASA: 3  Anesthesia Plan: General   Post-op Pain Management: Tylenol PO  (pre-op)*   Induction: Intravenous  PONV Risk Score and Plan: 3 and Dexamethasone, Ondansetron and Treatment may vary due to age or medical condition  Airway Management Planned: Oral ETT  Additional Equipment:   Intra-op Plan:   Post-operative Plan: Extubation in OR  Informed Consent: I have reviewed the patients History and Physical, chart, labs and discussed the procedure including the risks, benefits and alternatives for the proposed anesthesia with the patient or authorized representative who has indicated his/her understanding and acceptance.     Dental advisory given  Plan Discussed with: CRNA  Anesthesia Plan Comments:         Anesthesia Quick Evaluation

## 2022-12-09 NOTE — Anesthesia Postprocedure Evaluation (Signed)
Anesthesia Post Note  Patient: Kara Willis  Procedure(s) Performed: ATRIAL FIBRILLATION ABLATION     Patient location during evaluation: Cath Lab Anesthesia Type: General Level of consciousness: awake and alert Pain management: pain level controlled Vital Signs Assessment: post-procedure vital signs reviewed and stable Respiratory status: spontaneous breathing, nonlabored ventilation, respiratory function stable and patient connected to nasal cannula oxygen Cardiovascular status: blood pressure returned to baseline and stable Postop Assessment: no apparent nausea or vomiting Anesthetic complications: no   There were no known notable events for this encounter.  Last Vitals:  Vitals:   12/08/22 1600 12/08/22 1700  BP: 133/70 (!) 147/74  Pulse: (!) 58 (!) 57  Resp: 13 13  Temp:    SpO2: 91% 92%    Last Pain:  Vitals:   12/08/22 1411  TempSrc:   PainSc: 0-No pain   Pain Goal:                   Collene Schlichter

## 2022-12-12 ENCOUNTER — Encounter (HOSPITAL_COMMUNITY): Payer: Self-pay | Admitting: Cardiovascular Disease

## 2022-12-12 LAB — POCT ACTIVATED CLOTTING TIME
Activated Clotting Time: 311 s
Activated Clotting Time: 317 s

## 2023-01-05 ENCOUNTER — Encounter (HOSPITAL_COMMUNITY): Payer: Self-pay | Admitting: Physician Assistant

## 2023-01-05 ENCOUNTER — Ambulatory Visit (HOSPITAL_COMMUNITY)
Admission: RE | Admit: 2023-01-05 | Discharge: 2023-01-05 | Disposition: A | Discharge status: Home / Self Care | Payer: Medicare PPO | Source: Ambulatory Visit | Attending: Physician Assistant | Admitting: Physician Assistant

## 2023-01-05 VITALS — BP 142/90 | HR 59 | Ht 69.0 in | Wt 194.4 lb

## 2023-01-05 DIAGNOSIS — I4819 Other persistent atrial fibrillation: Secondary | ICD-10-CM

## 2023-01-05 DIAGNOSIS — Z96652 Presence of left artificial knee joint: Secondary | ICD-10-CM | POA: Insufficient documentation

## 2023-01-05 DIAGNOSIS — I480 Paroxysmal atrial fibrillation: Secondary | ICD-10-CM | POA: Diagnosis not present

## 2023-01-05 DIAGNOSIS — D6869 Other thrombophilia: Secondary | ICD-10-CM | POA: Diagnosis not present

## 2023-01-05 DIAGNOSIS — R9431 Abnormal electrocardiogram [ECG] [EKG]: Secondary | ICD-10-CM | POA: Diagnosis present

## 2023-01-05 DIAGNOSIS — I4891 Unspecified atrial fibrillation: Secondary | ICD-10-CM | POA: Diagnosis present

## 2023-01-05 NOTE — Progress Notes (Signed)
Primary Care Physician: Roger Kill, PA-C Primary Cardiologist: Dr. Elease Hashimoto Primary Electrophysiologist: Dr Nelly Laurence Referring Physician: Tereso Newcomer PA   Kara Willis is a 82 y.o. female with a history of left knee replacement and atrial fibrillation who presents for consultation in the [facility].  The patient was initially diagnosed with atrial fibrillation as an incidental finding during routine physical on 05/12/22. She did not know she was in atrial fibrillation. She was started on Toprol 25 mg 1/2 tablet daily and already taking a baby ASA. She was seen by Cardiology on 05/23/22 and still in Afib. ASA was stopped. She was started on Eliquis 5 mg BID due to a CHADS2VASC score of 3. She is now s/p DCCV on 06/29/22 with successful conversion to NSR with 1 shock.  On evaluation 07/07/22, she has been doing well since DCCV. She is in Afib today but states she actually feels better since the DCCV. She cannot tell me when she went back into Afib. She is doing water aerobics every Tuesday and Friday morning. Yesterday, she walked 1.25 miles without issue. She states she does not feel any shortness of breath or fatigue. She has been taking Toprol 1/2 tablet qhs. No missed doses of Eliquis.   Husband denies she snores. She feels well rested in the morning and denies feeling somnolent / fatigued throughout the day.  She was started on amiodarone as a bridge to ablation and underwent DCCV on 09/01/22 and then afib ablation with Dr Nelly Laurence on 12/08/22.  On follow up today, patient is doing well post ablation. Her amiodarone was stopped at the time of ablation. She did have some dizziness with activity and position change right after the procedure but this is improving. She was dizzy while taking amiodarone. She denies chest pain, swallowing pain, or groin issues.   Today, she denies symptoms of palpitations, chest pain, shortness of breath, orthopnea, PND, lower  extremity edema, presyncope, syncope, snoring, daytime somnolence, bleeding, or neurologic sequela. The patient is tolerating medications without difficulties and is otherwise without complaint today.   Atrial Fibrillation Risk Factors:  she does not have symptoms or diagnosis of sleep apnea. she does not have a history of rheumatic fever. she does not have a history of alcohol use. The patient does not have a history of early familial atrial fibrillation or other arrhythmias.   Atrial Fibrillation Management history:  Previous antiarrhythmic drugs: None Previous cardioversions: 06/29/22 Previous ablations: 12/08/22 Anticoagulation history: Eliquis   Past Medical History:  Diagnosis Date   Arthritis    DJD (degenerative joint disease)     Current Outpatient Medications  Medication Sig Dispense Refill   acetaminophen (TYLENOL) 500 MG tablet Take 500 mg by mouth every 6 (six) hours as needed for moderate pain.     apixaban (ELIQUIS) 5 MG TABS tablet Take 1 tablet (5 mg total) by mouth 2 (two) times daily. 180 tablet 3   cholecalciferol (VITAMIN D) 1000 units tablet Take 1,000 Units by mouth daily.     latanoprost (XALATAN) 0.005 % ophthalmic solution Place 1 drop into both eyes at bedtime.     Multiple Vitamin (MULTIVITAMIN WITH MINERALS) TABS tablet Take 1 tablet by mouth daily.     pravastatin (PRAVACHOL) 40 MG tablet Take 40 mg by mouth daily.     No current facility-administered medications for this encounter.    ROS- All systems are reviewed and negative except as per the HPI above.  Physical Exam: Vitals:  01/05/23 1120  BP: (!) 142/90  Pulse: (!) 59  Weight: 88.2 kg  Height: 5\' 9"  (1.753 m)    GEN: Well nourished, well developed in no acute distress NECK: No JVD; No carotid bruits CARDIAC: Regular rate and rhythm, no murmurs, rubs, gallops RESPIRATORY:  Clear to auscultation without rales, wheezing or rhonchi  ABDOMEN: Soft, non-tender,  non-distended EXTREMITIES:  No edema; No deformity    Wt Readings from Last 3 Encounters:  01/05/23 88.2 kg  12/08/22 86.2 kg  09/22/22 92.7 kg    EKG today demonstrates  SB Vent. rate 59 BPM PR interval 196 ms QRS duration 84 ms QT/QTcB 442/437 ms   Echo 05/25/22 demonstrated:  1. Left ventricular ejection fraction by 3D volume is 63 %. The left  ventricle has normal function. The left ventricle has no regional wall  motion abnormalities. Left ventricular diastolic parameters are  indeterminate.   2. Right ventricular systolic function is normal. The right ventricular  size is normal. There is normal pulmonary artery systolic pressure.   3. The mitral valve is normal in structure. Moderate mitral valve  regurgitation. No evidence of mitral stenosis.   4. Tricuspid valve regurgitation is moderate.   5. The aortic valve is normal in structure. Aortic valve regurgitation is  mild. No aortic stenosis is present.   6. The inferior vena cava is normal in size with greater than 50%  respiratory variability, suggesting right atrial pressure of 3 mmHg.    Epic records are reviewed at length today  CHA2DS2-VASc Score = 3  The patient's score is based upon: CHF History: 0 HTN History: 0 Diabetes History: 0 Stroke History: 0 Vascular Disease History: 0 Age Score: 2 Gender Score: 1       ASSESSMENT AND PLAN: Persistent Atrial Fibrillation (ICD10:  I48.19) The patient's CHA2DS2-VASc score is 3, indicating a 3.2% annual risk of stroke.   S/p afib ablation 12/08/22, now off amiodarone.  Patient appears to be maintaining SR Continue Eliquis 5 mg BID with no missed doses for 3 months post ablation  Secondary Hypercoagulable State (ICD10:  D68.69) The patient is at significant risk for stroke/thromboembolism based upon her CHA2DS2-VASc Score of 3.  Continue Apixaban (Eliquis).     Follow up with Dr Nelly Laurence as scheduled.    Jorja Loa PA-C Afib Clinic [facility] [address] [place], [place] [address] [phone] 01/05/2023 11:52 AM

## 2023-02-05 ENCOUNTER — Ambulatory Visit: Payer: Medicare PPO | Admitting: Cardiovascular Disease

## 2023-02-20 ENCOUNTER — Other Ambulatory Visit (HOSPITAL_COMMUNITY): Payer: Medicare PPO

## 2023-03-14 ENCOUNTER — Encounter: Payer: Self-pay | Admitting: Cardiovascular Disease

## 2023-03-14 ENCOUNTER — Ambulatory Visit: Payer: Medicare PPO | Attending: Cardiovascular Disease | Admitting: Cardiovascular Disease

## 2023-03-14 VITALS — BP 134/74 | HR 66 | Ht 69.0 in | Wt 197.0 lb

## 2023-03-14 DIAGNOSIS — I4819 Other persistent atrial fibrillation: Secondary | ICD-10-CM

## 2023-03-14 NOTE — Patient Instructions (Signed)
Medication Instructions:  Your physician recommends that you continue on your current medications as directed. Please refer to the Current Medication list given to you today. *If you need a refill on your cardiac medications before your next appointment, please call your pharmacy*   Follow-Up: At [facility], you and your health needs are our priority.  As part of our continuing mission to provide you with exceptional heart care, we have created designated Provider Care Teams.  These Care Teams include your primary Cardiologist (physician) and Advanced Practice Providers (APPs -  Physician Assistants and Nurse Practitioners) who all work together to provide you with the care you need, when you need it.  We recommend signing up for the patient portal called "MyChart".  Sign up information is provided on this After Visit Summary.  MyChart is used to connect with patients for Virtual Visits (Telemedicine).  Patients are able to view lab/test results, encounter notes, upcoming appointments, etc.  Non-urgent messages can be sent to your provider as well.   To learn more about what you can do with MyChart, go to [email or web address].    Your next appointment:   6 month(s)  Provider:   You will see one of the following Advanced Practice Providers on your designated Care Team:   Francis Dowse, Charlott Holler [address] [place], [place] Sherie Don, NP Canary Brim, NP

## 2023-03-14 NOTE — Progress Notes (Signed)
  Electrophysiology Office Note:    Date:  03/14/2023   ID:  LEATH [name], DOB 02/09/1941, MRN [id number]  PCP:  Bernita Buffy   [facility] Providers Cardiologist:  Kristeen Miss, MD Electrophysiologist:  Maurice Small, MD     Referring MD: Roger Kill, *   History of Present Illness:    Kara Willis is a 82 y.o. female with a hx listed below, significant for atrial fibrillation, referred for arrhythmia management.  She was found to have AF on a routine physical. She is not aware if she is in AF because she does not have palpitations, but she has had fatigue and dyspnea that correlate with AF. She had a cardioversion with return of AF, was started on amiodarone and has had a repeat DCCV with maintenance of sinus rhythm to-date.   She underwent atrial fibrillation ablation on December 08, 2022 with pulsed Marketing executive.  We ablated the pulmonary veins and posterior wall.  She reports that she has been doing very well since the ablation and has not had any symptoms or evidence of A-fib recurrence otherwise.    EKGs/Labs/Other Studies Reviewed Today:    Echocardiogram:  TTE 05/25/2022 EF 60-65%. LA normal in size   Monitors:   Stress testing:   Advanced imaging:   Cardiac catherization   EKG:  Last EKG results: sinus rhythm, 69 bpm   Recent Labs: 11/29/2022: BUN 17; Creatinine, Ser 1.16; Hemoglobin 11.8; Platelets 233; Potassium 4.7; Sodium 141     Physical Exam:    VS:  BP 134/74   Pulse 66   Ht 5\' 9"  (1.753 m)   Wt 197 lb (89.4 kg)   SpO2 98%   BMI 29.09 kg/m     Wt Readings from Last 3 Encounters:  03/14/23 197 lb (89.4 kg)  01/05/23 194 lb 6.4 oz (88.2 kg)  12/08/22 190 lb (86.2 kg)     GEN: Well nourished, well developed in no acute distress CARDIAC: RRR, no murmurs, rubs, gallops RESPIRATORY:  Normal work of breathing MUSCULOSKELETAL: no edema    ASSESSMENT & PLAN:    Persistent atrial  fibrillation Symptomatic  S/p pulmonary vein and LA posterior wall ablation with PFA 11/2022 No evidence of AF recurrence   Secondary hypercoagulable state CHA2DS2Vasc 3 - continue eliquis 5mg       Medication Adjustments/Labs and Tests Ordered: Current medicines are reviewed at length with the patient today.  Concerns regarding medicines are outlined above.  Orders Placed This Encounter  Procedures   EKG 12-Lead   No orders of the defined types were placed in this encounter.    Signed, Maurice Small, MD  03/14/2023 3:16 PM    [facility]

## 2023-05-05 ENCOUNTER — Other Ambulatory Visit: Payer: Self-pay | Admitting: Cardiovascular Disease

## 2023-05-05 DIAGNOSIS — I480 Paroxysmal atrial fibrillation: Secondary | ICD-10-CM

## 2023-05-05 DIAGNOSIS — E782 Mixed hyperlipidemia: Secondary | ICD-10-CM

## 2023-05-07 NOTE — Telephone Encounter (Signed)
Prescription refill request for Eliquis received. Indication:afib Last office visit:12/24 Scr:1.16  8/24 Age: 83 Weight:89.4  kg  Prescription refilled

## 2023-05-22 ENCOUNTER — Ambulatory Visit (HOSPITAL_COMMUNITY): Payer: Medicare PPO | Attending: Internal Medicine

## 2023-05-22 ENCOUNTER — Telehealth: Payer: Self-pay | Admitting: *Deleted

## 2023-05-22 DIAGNOSIS — I340 Nonrheumatic mitral (valve) insufficiency: Secondary | ICD-10-CM

## 2023-05-22 LAB — ECHOCARDIOGRAM COMPLETE
Area-P 1/2: 3.42 cm2
MV M vel: 4.94 m/s
MV Peak grad: 97.6 mm[Hg]
MV VTI: 0.47 cm2
Radius: 0.8 cm
S' Lateral: 3.4 cm

## 2023-05-22 NOTE — Telephone Encounter (Signed)
-----   Message from Tereso Newcomer sent at 05/22/2023 12:55 PM EST ----- Results sent to Kara Willis via MyChart. See MyChart comments below. I have sent a copy to her PCP as FYI. PLAN:  -Schedule echocardiogram in 1 year (mitral regurgitation, tricuspid regurgitation)  [name]  Your echocardiogram shows normal ejection fraction (heart function). Your mitral valve leakage (mitral regurgitation) and tricuspid valve leakage (tricuspid regurgitation) are stable compared to the echocardiogram done last year. There is mild increase in lung pressures (pulmonary hypertension). We will repeat your echocardiogram again in 1 year to continue to monitor the mitral regurgitation and tricuspid regurgitation.  Tereso Newcomer, PA-C

## 2023-06-01 DIAGNOSIS — I480 Paroxysmal atrial fibrillation: Secondary | ICD-10-CM | POA: Diagnosis not present

## 2023-06-01 DIAGNOSIS — I340 Nonrheumatic mitral (valve) insufficiency: Secondary | ICD-10-CM | POA: Diagnosis not present

## 2023-06-01 DIAGNOSIS — Z1231 Encounter for screening mammogram for malignant neoplasm of breast: Secondary | ICD-10-CM | POA: Diagnosis not present

## 2023-06-01 DIAGNOSIS — Z0000 Encounter for general adult medical examination without abnormal findings: Secondary | ICD-10-CM | POA: Diagnosis not present

## 2023-06-01 DIAGNOSIS — M8589 Other specified disorders of bone density and structure, multiple sites: Secondary | ICD-10-CM | POA: Diagnosis not present

## 2023-06-01 DIAGNOSIS — I071 Rheumatic tricuspid insufficiency: Secondary | ICD-10-CM | POA: Diagnosis not present

## 2023-06-01 DIAGNOSIS — E785 Hyperlipidemia, unspecified: Secondary | ICD-10-CM | POA: Diagnosis not present

## 2023-06-13 ENCOUNTER — Other Ambulatory Visit: Payer: Self-pay | Admitting: Obstetrics and Gynecology

## 2023-06-13 DIAGNOSIS — Z1231 Encounter for screening mammogram for malignant neoplasm of breast: Secondary | ICD-10-CM

## 2023-08-01 DIAGNOSIS — Z789 Other specified health status: Secondary | ICD-10-CM | POA: Diagnosis not present

## 2023-08-02 DIAGNOSIS — L821 Other seborrheic keratosis: Secondary | ICD-10-CM | POA: Diagnosis not present

## 2023-08-02 DIAGNOSIS — D225 Melanocytic nevi of trunk: Secondary | ICD-10-CM | POA: Diagnosis not present

## 2023-08-02 DIAGNOSIS — Z85828 Personal history of other malignant neoplasm of skin: Secondary | ICD-10-CM | POA: Diagnosis not present

## 2023-08-02 DIAGNOSIS — L570 Actinic keratosis: Secondary | ICD-10-CM | POA: Diagnosis not present

## 2023-08-02 DIAGNOSIS — L814 Other melanin hyperpigmentation: Secondary | ICD-10-CM | POA: Diagnosis not present

## 2023-08-02 DIAGNOSIS — L308 Other specified dermatitis: Secondary | ICD-10-CM | POA: Diagnosis not present

## 2023-08-02 DIAGNOSIS — I788 Other diseases of capillaries: Secondary | ICD-10-CM | POA: Diagnosis not present

## 2023-08-21 ENCOUNTER — Ambulatory Visit
Admission: RE | Admit: 2023-08-21 | Discharge: 2023-08-21 | Disposition: A | Discharge status: Home / Self Care | Source: Ambulatory Visit | Attending: Obstetrics and Gynecology | Admitting: Obstetrics and Gynecology

## 2023-08-21 DIAGNOSIS — Z1231 Encounter for screening mammogram for malignant neoplasm of breast: Secondary | ICD-10-CM | POA: Diagnosis not present

## 2023-09-21 ENCOUNTER — Encounter: Payer: Self-pay | Admitting: Physician Assistant

## 2023-10-02 NOTE — Progress Notes (Signed)
   10/02/2023  Patient ID: Kara Willis, female   DOB: 1941/01/10, 83 y.o.   MRN: [id number]  Pharmacy Quality Measure Review  This patient is appearing on a report for being at risk of failing the adherence measure for cholesterol (statin) medications this calendar year.   Medication: Pravastatin  40mg  Last fill date: 07/31/23 for 90 day supply  Insurance report was not up to date. No action needed at this time.   Jon VEAR Lindau, PharmD Clinical Pharmacist [phone]

## 2023-10-04 ENCOUNTER — Encounter: Payer: Self-pay | Admitting: Cardiovascular Disease

## 2023-10-04 NOTE — Progress Notes (Unsigned)
 Cardiology Office Note:    Date:  10/08/2023   ID:  Kara Willis, DOB 1940-12-08, MRN [id number]  PCP:  Kara Willis   [facility] Providers Cardiologist:  Kara Passe, MD Electrophysiologist:  Kara FORBES Furbish, MD     Referring MD: Kara Willis, *   Chief Complaint  Patient presents with   Atrial Fibrillation         History of Present Illness:  05/23/22    Kara Willis is a 83 y.o. female with a hx of atrial fib .  We are asked to see her for further eval and managemet of her new atrial fib   Has been short of breath - gradually worsened since Oct.   She is completely asymptomatic from an A-fib standpoint.  She cannot cannot tell that her heart is beating irregularly. She was found to have atrial fibrillation on a routine physical.  She was started on metoprolol .  She was referred to us  to help decide which anticoagulant she should take.  Able to do all of her normal activities  Does water aerobic 2-3 times a week ,  walks regularly   No signs or symptoms of OSA  TSH = 2.75   October 05, 2023  Kara Willis is seen for follow up of her atrial fib She had a cardioversion and eventually had an afib ablation by Dr. Furbish   No palpitations  Does water aerobic 4 days  Walks daily  No cP , no dyspnea , no syncope   She wants to see Dr.  [name] and Kara Willis in the future .      Past Medical History:  Diagnosis Date   Arthritis    DJD (degenerative joint disease)     Past Surgical History:  Procedure Laterality Date   APPENDECTOMY     ATRIAL FIBRILLATION ABLATION N/A 12/08/2022   Procedure: ATRIAL FIBRILLATION ABLATION;  Surgeon: Willis Kara FORBES, MD;  Location: [facility] INVASIVE CV LAB;  Service: Cardiovascular;  Laterality: N/A;   BREAST EXCISIONAL BIOPSY Left    left 1998   CARDIOVERSION N/A 06/29/2022   Procedure: CARDIOVERSION;  Surgeon: Loni Soyla LABOR, MD;  Location: [facility] ENDOSCOPY;  Service:  Cardiovascular;  Laterality: N/A;   CARDIOVERSION N/A 09/01/2022   Procedure: CARDIOVERSION;  Surgeon: Lonni Slain, MD;  Location: [facility] INVASIVE CV LAB;  Service: Cardiovascular;  Laterality: N/A;   COLONOSCOPY     DILATION AND CURETTAGE OF UTERUS     TONSILLECTOMY     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   cholecalciferol (VITAMIN D) 1000 units tablet Take 1,000 Units by mouth daily.   ELIQUIS  5 MG TABS tablet TAKE 1 TABLET TWICE DAILY   latanoprost  (XALATAN ) 0.005 % ophthalmic solution Place 1 drop into both eyes at bedtime.   Multiple Vitamin (MULTIVITAMIN WITH MINERALS) TABS tablet Take 1 tablet by mouth daily.   pravastatin  (PRAVACHOL ) 40 MG tablet Take 40 mg by mouth daily.     Allergies:   Patient has no known allergies.  Social History   Socioeconomic History   Marital status: Married    Spouse name: Not on file   Number of children: Not on file   Years of education: Not on file   Highest education level: Not on file  Occupational History   Not on file  Tobacco Use   Smoking status: Never   Smokeless tobacco: Never   Tobacco comments:    Never smoke 07/07/22  Substance and Sexual Activity   Alcohol use: Not Currently    Alcohol/week: 7.0 standard drinks of alcohol    Types: 7 Glasses of wine per week   Drug use: No   Sexual activity: Not on file  Other Topics Concern   Not on file  Social History Narrative   Not on file   Social Drivers of Health   Financial Resource Strain: Not on  file  Food Insecurity: Low Risk  (06/01/2023)   Received from [facility]   Hunger Vital Sign    Within the past 12 months, you worried that your food would run out before you got money to buy more: Never true    Within the past 12 months, the food you bought just didn't last and you didn't have money to get more. : Never true  Transportation Needs: No Transportation Needs (06/01/2023)   Received from [facility]    In the past 12 months, has lack of reliable transportation kept you from medical appointments, meetings, work or from getting things needed for daily living? : No  Physical Activity: Not on file  Stress: Not on file  Social Connections: Not on file     Family History: The patient's family history is not on file.  ROS:   Please see the history of present illness.     All other systems reviewed and are negative.  EKGs/Labs/Other Studies Reviewed:    The following studies were reviewed today:   EKG:     Recent Labs: 11/29/2022: BUN 17; Creatinine, Ser 1.16; Hemoglobin 11.8; Platelets 233; Potassium 4.7; Sodium 141  Recent Lipid Panel No results found for: CHOL, TRIG, HDL, CHOLHDL, VLDL, LDLCALC, LDLDIRECT   Risk Assessment/Calculations:    CHA2DS2-VASc Score =      This indicates a  % annual risk of stroke. The patient's score is based upon:          Physical Exam:     Physical Exam: Blood pressure 122/78, pulse 61, height 5' 9 (1.753 m), weight 202 lb (91.6 kg), SpO2 99%.   GEN:  Well nourished, well developed in no acute distress HEENT: Normal NECK: No JVD; No carotid bruits LYMPHATICS: No lymphadenopathy CARDIAC: RRR , no murmurs, rubs, gallops RESPIRATORY:  Clear to auscultation without rales, wheezing or rhonchi  ABDOMEN: Soft, non-tender, non-distended MUSCULOSKELETAL:  No edema; No deformity  SKIN: Warm and dry NEUROLOGIC:  Alert and oriented x 3   ASSESSMENT:    1. Nonrheumatic mitral valve  regurgitation   2. Persistent atrial fibrillation (HCC)   3. Hypertension, unspecified type     PLAN:      Atrial fibrillation: she is s/p ablation . Doing well  Cont eliquis    Will follow up with Kara Ferrier, Pa in a year  Wants to follow up with Dr. [name] in the future  Will also See Kara Willis for EP               Medication Adjustments/Labs and Tests Ordered: Current medicines are reviewed at length with the patient  today.  Concerns regarding medicines are outlined above.  No orders of the defined types were placed in this encounter.  No orders of the defined types were placed in this encounter.   Patient Instructions  Medication Instructions:  Your physician recommends that you continue on your current medications as directed. Please refer to the Current Medication list given to you today.  *If you need a refill on your cardiac medications before your next appointment, please call your pharmacy*  Lab Work: None  If you have labs (blood work) drawn today and your tests are completely normal, you will receive your results only by: MyChart Message (if you have MyChart) OR A paper copy in the mail If you have any lab test that is abnormal or we need to change your treatment, we will call you to review the results.  Testing/Procedures: None  Follow-Up: At [facility], you and your health needs are our priority.  As part of our continuing mission to provide you with exceptional heart care, our providers are all part of one team.  This team includes your primary Cardiologist (physician) and Advanced Practice Providers or APPs (Physician Assistants and Nurse Practitioners) who all work together to provide you with the care you need, when you need it.  Your next appointment:   1 year  Provider:   [name], PA-C        Signed, Kara Passe, MD  10/08/2023 3:56 PM    [facility]

## 2023-10-05 ENCOUNTER — Encounter: Payer: Self-pay | Admitting: Cardiovascular Disease

## 2023-10-05 ENCOUNTER — Ambulatory Visit: Attending: Cardiovascular Disease | Admitting: Cardiovascular Disease

## 2023-10-05 VITALS — BP 122/78 | HR 61 | Ht 69.0 in | Wt 202.0 lb

## 2023-10-05 DIAGNOSIS — I340 Nonrheumatic mitral (valve) insufficiency: Secondary | ICD-10-CM | POA: Diagnosis not present

## 2023-10-05 DIAGNOSIS — I10 Essential (primary) hypertension: Secondary | ICD-10-CM

## 2023-10-05 DIAGNOSIS — I4819 Other persistent atrial fibrillation: Secondary | ICD-10-CM | POA: Diagnosis not present

## 2023-10-05 NOTE — Patient Instructions (Signed)
 Medication Instructions:  Your physician recommends that you continue on your current medications as directed. Please refer to the Current Medication list given to you today.  *If you need a refill on your cardiac medications before your next appointment, please call your pharmacy*  Lab Work: None  If you have labs (blood work) drawn today and your tests are completely normal, you will receive your results only by: MyChart Message (if you have MyChart) OR A paper copy in the mail If you have any lab test that is abnormal or we need to change your treatment, we will call you to review the results.  Testing/Procedures: None  Follow-Up: At [facility], you and your health needs are our priority.  As part of our continuing mission to provide you with exceptional heart care, our providers are all part of one team.  This team includes your primary Cardiologist (physician) and Advanced Practice Providers or APPs (Physician Assistants and Nurse Practitioners) who all work together to provide you with the care you need, when you need it.  Your next appointment:   1 year  Provider:   [name], PA-C

## 2023-11-18 IMAGING — MG MM DIGITAL SCREENING BILAT W/ TOMO AND CAD
8 series · 8 of 24 positions shown · non-contrast
Comparison: Previous exam(s).

CLINICAL DATA: Screening.

EXAM:
DIGITAL SCREENING BILATERAL MAMMOGRAM WITH TOMOSYNTHESIS AND CAD
TECHNIQUE: Bilateral screening digital craniocaudal and mediolateral oblique
mammograms were obtained. Bilateral screening digital breast
tomosynthesis was performed. The images were evaluated with
computer-aided detection.

[L CC synth-2D]
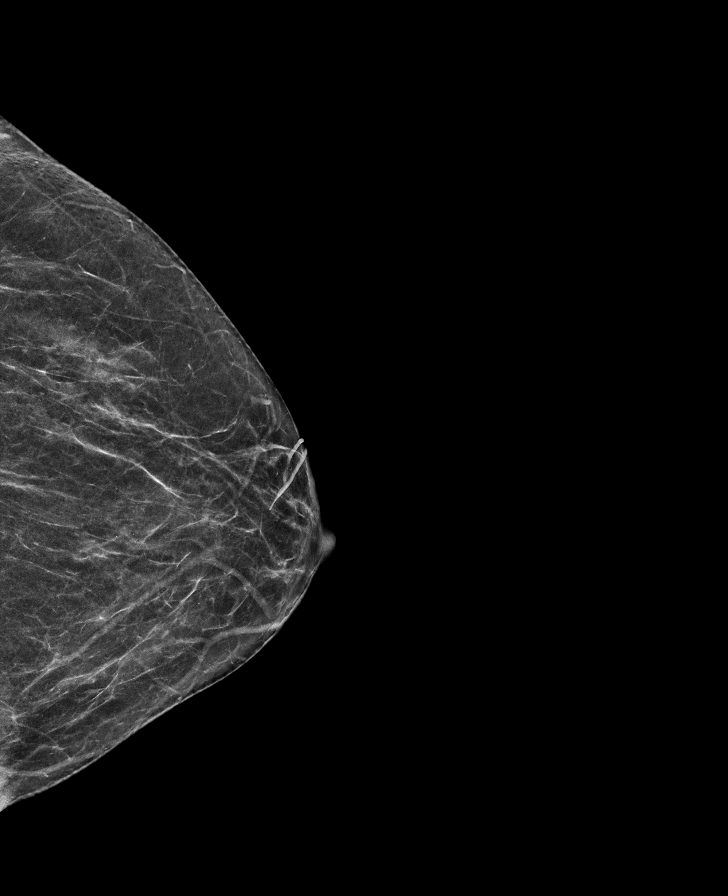

[R CC synth-2D]
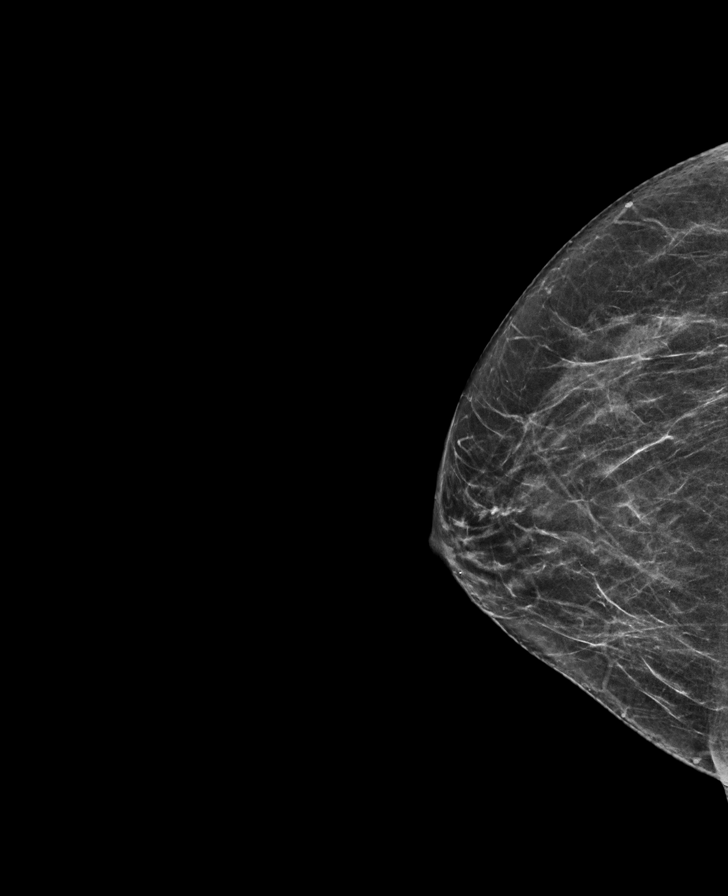

[R MLO synth-2D]
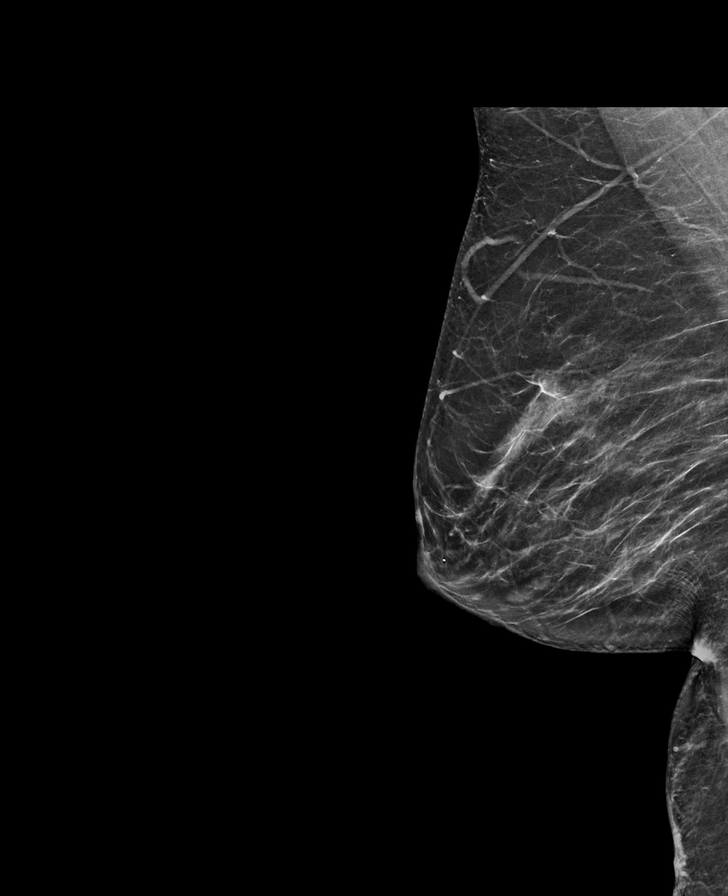

[L MLO synth-2D]
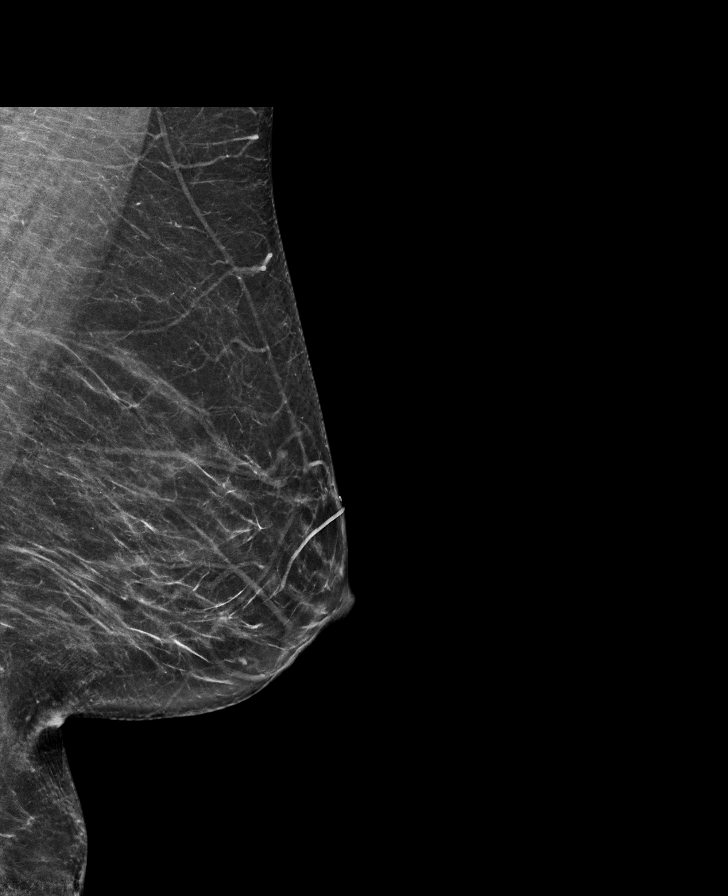

[R CC tomo · tomo slice 28/55.0]
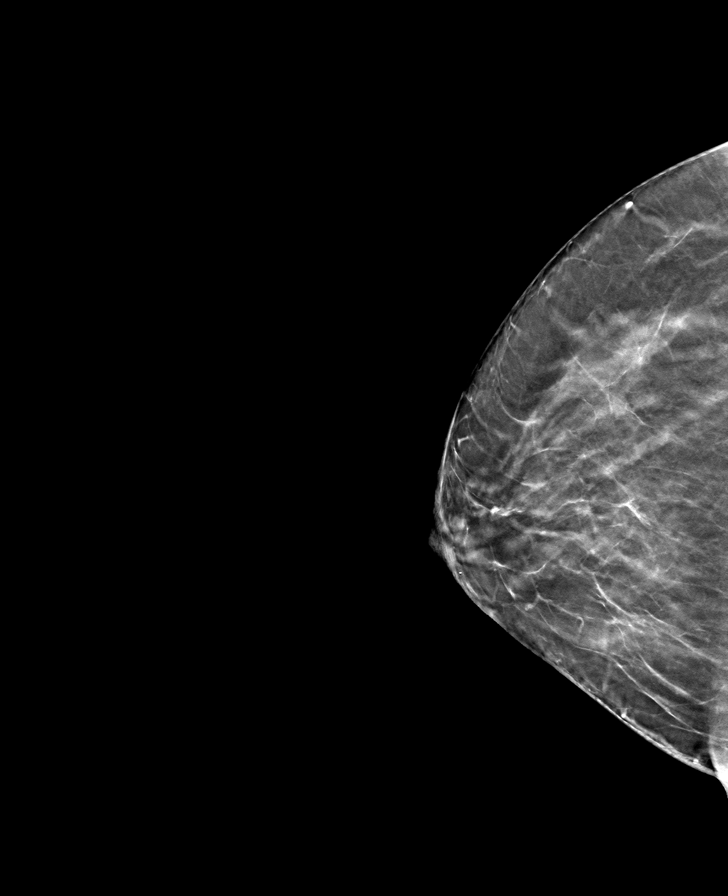

[R MLO tomo · tomo slice 32/63.0]
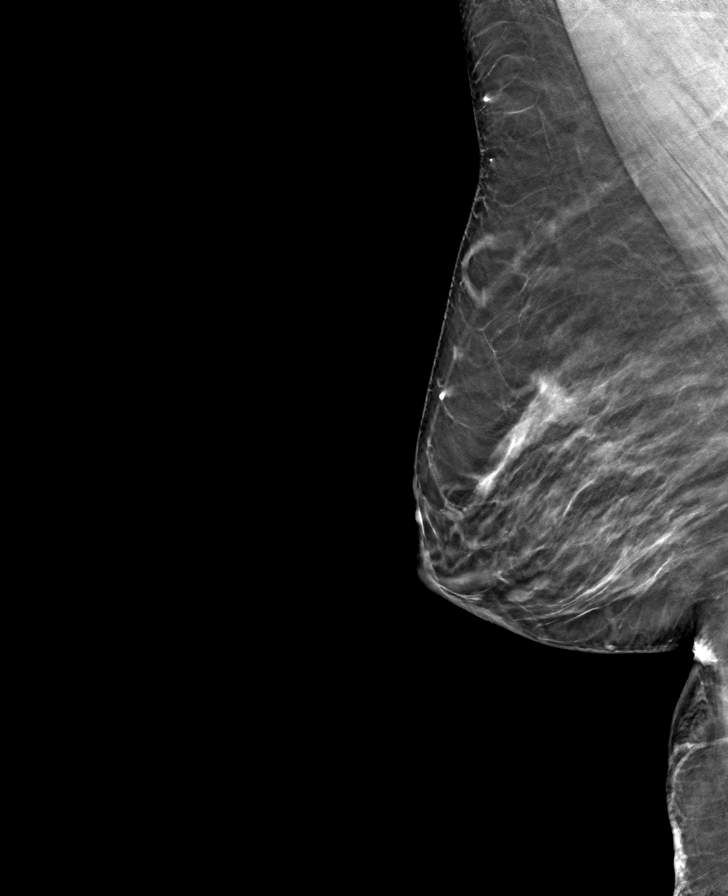

[L CC tomo · tomo slice 29/58.0]
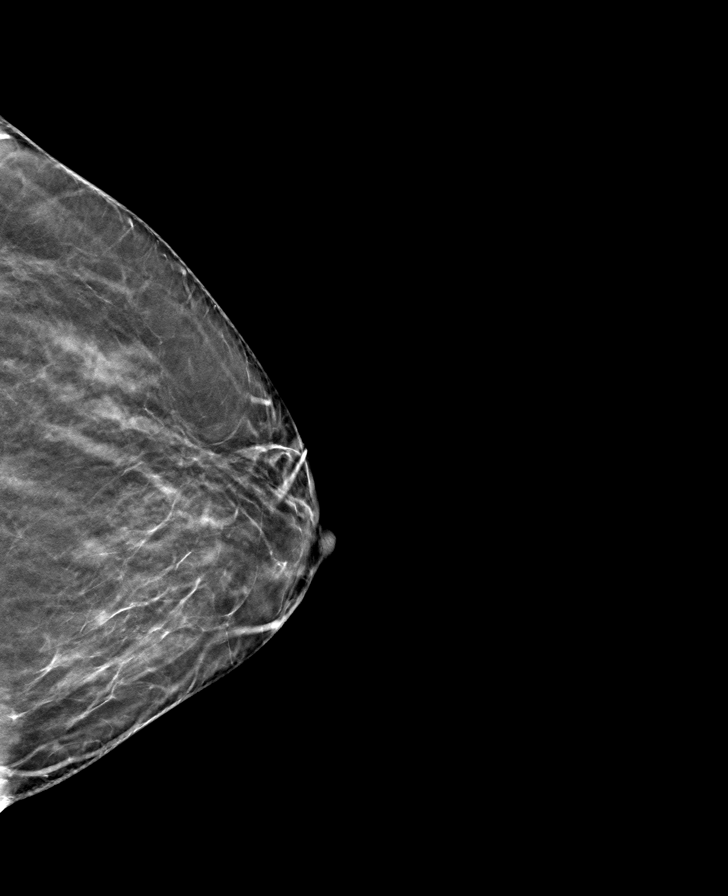

[L MLO tomo · tomo slice 33/66.0]
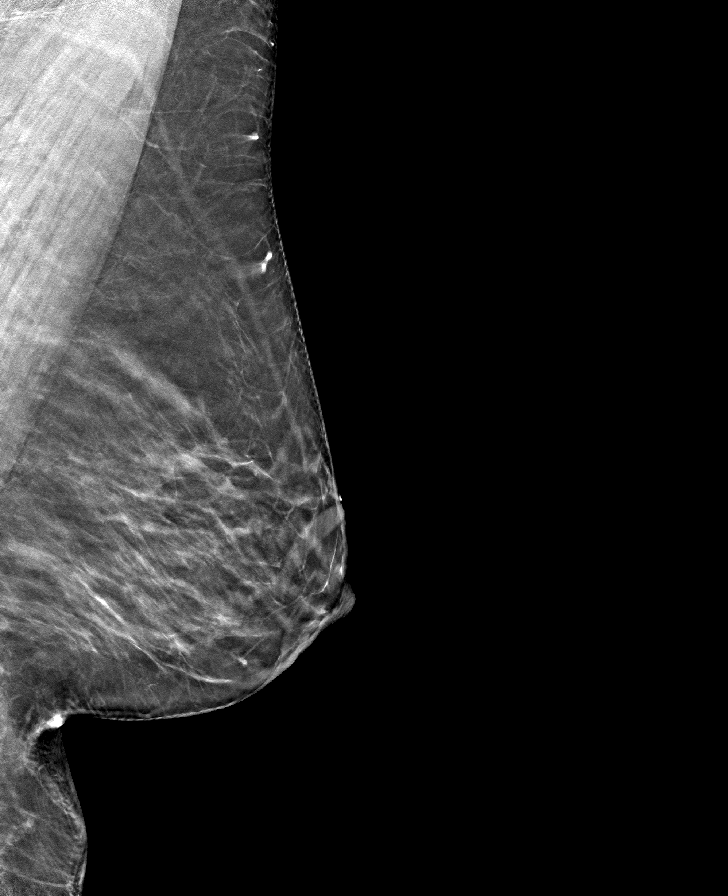

[8 of 24 positions shown; findings below may reference images not displayed]

ACR Breast Density Category b: There are scattered areas of
fibroglandular density.
FINDINGS: There are no findings suspicious for malignancy.
IMPRESSION: No mammographic evidence of malignancy. A result letter of this
screening mammogram will be mailed directly to the patient.

RECOMMENDATION:
Screening mammogram in one year. (Code:51-O-LD2)

BI-RADS CATEGORY  1: Negative.

## 2023-11-19 DIAGNOSIS — H401131 Primary open-angle glaucoma, bilateral, mild stage: Secondary | ICD-10-CM | POA: Diagnosis not present

## 2024-05-21 ENCOUNTER — Ambulatory Visit (HOSPITAL_COMMUNITY)
